# Patient Record
Sex: Female | Born: 1985 | Race: Black or African American | Hispanic: No | Marital: Single | Smoking: Current every day smoker
Health system: Southern US, Community
[De-identification: ages and names within clinical notes are randomized; demographics above are authoritative.]

## PROBLEM LIST (undated history)

## (undated) DIAGNOSIS — D573 Sickle-cell trait: Secondary | ICD-10-CM

## (undated) DIAGNOSIS — Z86718 Personal history of other venous thrombosis and embolism: Secondary | ICD-10-CM

## (undated) DIAGNOSIS — I10 Essential (primary) hypertension: Secondary | ICD-10-CM

## (undated) HISTORY — DX: Personal history of other venous thrombosis and embolism: Z86.718

## (undated) HISTORY — DX: Sickle-cell trait: D57.3

## (undated) HISTORY — DX: Essential (primary) hypertension: I10

---
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
## 2005-07-04 ENCOUNTER — Ambulatory Visit (HOSPITAL_COMMUNITY): Admission: AD | Admit: 2005-07-04 | Discharge: 2005-07-04 | Payer: Self-pay | Admitting: Obstetrics & Gynecology

## 2007-06-20 ENCOUNTER — Emergency Department (HOSPITAL_COMMUNITY): Admission: EM | Admit: 2007-06-20 | Discharge: 2007-06-20 | Payer: Self-pay | Admitting: Emergency Medicine

## 2007-12-17 ENCOUNTER — Emergency Department (HOSPITAL_COMMUNITY): Admission: EM | Admit: 2007-12-17 | Discharge: 2007-12-17 | Payer: Self-pay | Admitting: Emergency Medicine

## 2007-12-18 ENCOUNTER — Emergency Department (HOSPITAL_COMMUNITY): Admission: EM | Admit: 2007-12-18 | Discharge: 2007-12-18 | Payer: Self-pay | Admitting: Emergency Medicine

## 2007-12-20 ENCOUNTER — Ambulatory Visit: Payer: Self-pay | Admitting: Family Medicine

## 2007-12-20 DIAGNOSIS — E669 Obesity, unspecified: Secondary | ICD-10-CM

## 2007-12-20 DIAGNOSIS — F17200 Nicotine dependence, unspecified, uncomplicated: Secondary | ICD-10-CM

## 2007-12-20 DIAGNOSIS — M25579 Pain in unspecified ankle and joints of unspecified foot: Secondary | ICD-10-CM | POA: Insufficient documentation

## 2007-12-21 ENCOUNTER — Encounter (INDEPENDENT_AMBULATORY_CARE_PROVIDER_SITE_OTHER): Payer: Self-pay | Admitting: Family Medicine

## 2008-01-02 ENCOUNTER — Encounter (INDEPENDENT_AMBULATORY_CARE_PROVIDER_SITE_OTHER): Payer: Self-pay | Admitting: Family Medicine

## 2008-01-13 ENCOUNTER — Encounter (INDEPENDENT_AMBULATORY_CARE_PROVIDER_SITE_OTHER): Payer: Self-pay | Admitting: Family Medicine

## 2008-01-20 ENCOUNTER — Ambulatory Visit: Payer: Self-pay | Admitting: Family Medicine

## 2008-02-01 ENCOUNTER — Encounter (INDEPENDENT_AMBULATORY_CARE_PROVIDER_SITE_OTHER): Payer: Self-pay | Admitting: Family Medicine

## 2008-02-28 ENCOUNTER — Encounter (INDEPENDENT_AMBULATORY_CARE_PROVIDER_SITE_OTHER): Payer: Self-pay | Admitting: Family Medicine

## 2008-02-29 LAB — CONVERTED CEMR LAB
Alkaline Phosphatase: 77 units/L (ref 39–117)
Chloride: 107 meq/L (ref 96–112)
Creatinine, Ser: 0.84 mg/dL (ref 0.40–1.20)
Glucose, Bld: 74 mg/dL (ref 70–99)
Sodium: 141 meq/L (ref 135–145)
TSH: 3.084 microintl units/mL (ref 0.350–4.50)
Total CHOL/HDL Ratio: 4.3
Triglycerides: 115 mg/dL (ref <150)
VLDL: 23 mg/dL (ref 0–40)

## 2008-03-02 ENCOUNTER — Ambulatory Visit: Payer: Self-pay | Admitting: Family Medicine

## 2008-03-02 LAB — CONVERTED CEMR LAB: Cholesterol, target level: 200 mg/dL

## 2008-04-13 ENCOUNTER — Ambulatory Visit: Payer: Self-pay | Admitting: Family Medicine

## 2008-04-13 LAB — CONVERTED CEMR LAB

## 2008-05-18 ENCOUNTER — Encounter (INDEPENDENT_AMBULATORY_CARE_PROVIDER_SITE_OTHER): Payer: Self-pay | Admitting: Family Medicine

## 2008-06-01 HISTORY — PX: IUD REMOVAL: SHX5392

## 2008-06-13 ENCOUNTER — Encounter (INDEPENDENT_AMBULATORY_CARE_PROVIDER_SITE_OTHER): Payer: Self-pay | Admitting: Family Medicine

## 2008-06-25 ENCOUNTER — Encounter (INDEPENDENT_AMBULATORY_CARE_PROVIDER_SITE_OTHER): Payer: Self-pay | Admitting: Family Medicine

## 2008-07-04 ENCOUNTER — Encounter (INDEPENDENT_AMBULATORY_CARE_PROVIDER_SITE_OTHER): Payer: Self-pay | Admitting: Family Medicine

## 2008-12-24 ENCOUNTER — Emergency Department (HOSPITAL_COMMUNITY): Admission: EM | Admit: 2008-12-24 | Discharge: 2008-12-24 | Payer: Self-pay | Admitting: Emergency Medicine

## 2009-05-13 ENCOUNTER — Emergency Department (HOSPITAL_COMMUNITY): Admission: EM | Admit: 2009-05-13 | Discharge: 2009-05-13 | Payer: Self-pay | Admitting: Emergency Medicine

## 2010-09-07 LAB — URINALYSIS, ROUTINE W REFLEX MICROSCOPIC
Bilirubin Urine: NEGATIVE
Nitrite: POSITIVE — AB
Specific Gravity, Urine: 1.01 (ref 1.005–1.030)
Urobilinogen, UA: 0.2 mg/dL (ref 0.0–1.0)

## 2010-09-07 LAB — URINE CULTURE: Colony Count: >=100000

## 2010-09-07 LAB — URINE MICROSCOPIC-ADD ON

## 2010-09-07 LAB — PREGNANCY, URINE: Preg Test, Ur: NEGATIVE

## 2010-10-17 NOTE — Consult Note (Signed)
[name], [name] NO.:  [id number]   MEDICAL RECORD NO.:  [id number]          PATIENT TYPE:  OBV   LOCATION:  A415                          FACILITY:  [facility]   PHYSICIAN:  Lazaro Arms, M.D.   DATE OF BIRTH:  05-03-1986   DATE OF CONSULTATION:  07/04/2005  DATE OF DISCHARGE:                                   CONSULTATION   Stacie Oliver is an 25 year old African-American female, gravida 1, estimated date  of delivery of April 13, currently at [redacted] weeks gestation who presented  complaining of lower abdominal pain. She was actually a patient of Dr.  Duanne Moron up at [facility] but was getting her hair fixed here in  [place]. She said she had been having the cramping since yesterday. She  also stated that when she went to the bathroom she had leakage of fluid but  only when she went to urinate. Her Nitrazine is negative. She has no fluid  at all. Her cervix is long, thick and closed. She is having occasional  cramping. Her urinalysis is clear. She was given 1 subcu terbutaline which  completed knocked out the cramping. She was instructed to go home, rest and  increase intact of her fluid. If she has any difficulty, to contact Dr.  Gilford Silvius or [facility] for whoever is covering Dr. Duanne Moron practice. The  patient voices understanding, and she will go home and rest and drink fluids  today.      Lazaro Arms, M.D.  Electronically Signed     LHE/MEDQ  D:  07/04/2005  T:  07/04/2005  Job:  [id number]   cc:   Almetta Lovely  Fax: [phone]
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
## 2020-08-13 ENCOUNTER — Emergency Department: Payer: Self-pay

## 2020-08-13 ENCOUNTER — Emergency Department
Admission: EM | Admit: 2020-08-13 | Discharge: 2020-08-13 | Disposition: A | Discharge status: Home / Self Care | Payer: Self-pay | Attending: Emergency Medicine | Admitting: Emergency Medicine

## 2020-08-13 DIAGNOSIS — M79605 Pain in left leg: Secondary | ICD-10-CM | POA: Insufficient documentation

## 2020-08-13 MED ORDER — HYDROCODONE-ACETAMINOPHEN 5-325 MG PO TABS
ORAL_TABLET | ORAL | Status: AC
Start: 2020-08-13 — End: ?
  Filled 2020-08-13: qty 2

## 2020-08-13 MED ORDER — IBUPROFEN 600 MG PO TABS
600.0000 mg | ORAL_TABLET | Freq: Four times a day (QID) | ORAL | 0 refills | Status: AC | PRN
Start: 2020-08-13 — End: ?

## 2020-08-13 MED ORDER — HYDROCODONE-ACETAMINOPHEN 5-325 MG PO TABS
2.0000 | ORAL_TABLET | Freq: Once | ORAL | Status: AC
Start: 2020-08-13 — End: 2020-08-13
  Administered 2020-08-13: 08:00:00 2 via ORAL

## 2020-08-13 MED ORDER — HYDROCODONE-ACETAMINOPHEN 5-325 MG PO TABS
1.0000 | ORAL_TABLET | Freq: Four times a day (QID) | ORAL | 0 refills | Status: DC | PRN
Start: 2020-08-13 — End: 2020-08-14

## 2020-08-13 NOTE — Discharge Instructions (Signed)
Myalgias  Myalgias are another word formuscle aches and soreness.This is a symptom, not a disease.Myalgias can have many causes.A cold, the flu,or any infection can cause them.So can any illness with a high fever.They may happen after heavy exercise or injury such as an accident or fall.Some medicines such as statins and certain antidepressants can cause myalgias.They can also be a symptom of long-term (chronic) health problems such as lupus,chronic fatigue, or hypothyroidism. With these illnesses, other serious symptoms often occur with muscle pain and soreness.   Myalgias most often go away on their own. If they don't go away, come back, or are severe, you may need tests to help find the cause.   Home care   Rest until you feel better.   Follow instructions that you were given for how to care for yourself. This may depend on the cause of your myalgias.   If myalgia is thought to be because of a medicine, talk with the doctor who prescribed the medicine about what to do.   To control pain, take prescription or over-the-counter medicines as directed. Unless told not to,you can try acetaminophen or ibuprofen.    Follow-up care  Follow up with your healthcare provider or as advised. If your symptoms don't go away in a few days or if they come back, follow up with your healthcare provider for an exam and testing.   When to see medical advice  Call your healthcare provider for any of the following:   Fever of100.19F (38C)or higher, or as directed by your healthcare provider   Pain that gets worse and not better, or that goes away and comes back   New joint pains   New rash   Severe headache, neck pain, drowsiness, or confusion  StayWell last reviewed this educational content on 12/30/2017     2000-2021 [employer], [place]. All rights reserved. This information is not intended as a substitute for professional medical care. Always follow your healthcare professional's  instructions.            Muscle Strain in the Extremities  A muscle strain is a stretching and tearing of muscle fibers. This causes pain, especially when you move that muscle. There may also be some swelling and bruising.  Home care   Keep the hurt area raised above heart level to reduce pain and swelling. This is especially important during the first 48 hours.   Apply an ice pack over the injured area for 15 to 20 minutes every3 to 6hours. You should do this forthe first24 to 48 hours.You can make an ice pack by filling a plastic bag that seals at the top with ice cubes and then wrapping it with a thin towel. Be careful not to injure your skin with the ice treatments. Ice should never be applied directly to skin. Continue the use of ice packs for relief of pain and swelling as needed. After 48 hours, apply heat(warm shower orwarm bath)for 15 to 20 minutes several times a day, or alternate ice and heat.   You may useover-the-counter pain medicine to control pain, unless another medicine was prescribed. If you have chronic liver or kidney disease or ever had a stomach ulcer or gastrointestinal bleeding, talk with your healthcare providerbeforeusing these medicines.   For leg strains: If crutches have been recommended, dont put full weight on the hurt leg until you can do so without pain. You can return to sports when you are able to hop and run on the injured leg  without pain.  Follow-up care  Follow up with yourhealthcare provider, or as advised.  When to seek medical advice  Call your healthcare provider right away if any of these occur:   The toes of the injured leg becomeswollen, cold, blue, numb, or tingly   Pain or swelling increases  StayWell last reviewed this educational content on 09/29/2016     2000-2021 [employer], [place]. All rights reserved. This information is not intended as a substitute for professional medical care. Always follow your healthcare professional's  instructions.

## 2020-08-13 NOTE — ED Provider Notes (Addendum)
[facility]  EMERGENCY DEPARTMENT  History and Physical Exam       ________________________________________________________________________        Patient Name:  Mercedes Moss, Mercedes Moss   Age/Sex:  35 y.o.  /  female     Attending Physician:  Kathrynn Running, MD   MRN:  [id number]     PCP:  Marisa Sprinkles, MD   Room:  E10/EDA10-A     Patent DOB:  1985/10/14   Encounter Date:  08/13/2020       ________________________________________________________________________      History of Presenting Illness     Chief complaint: Leg Pain    HPI/ROS is limited by: none  HPI/ROS given by: patient          Mercedes Moss is a 35 y.o. female who presents to the ED with complaint of Leg Pain      HPI     Context: Patient presents complaining of left calf pain.  She is a Biomedical scientist.  No trauma.  She denies any chest pain or shortness of breath.  No previous DVTs.    Provocative: Palpation of the left calf    Pallative Factors: None    Quality: Dull ache    Region: Left calf    Radiation: None    Severity: Moderate    Temporal Factors: Began yesterday        Associated symptoms: No chest pain or shortness of breath         Review of Systems        Review of Systems   Respiratory: Negative for shortness of breath.    Cardiovascular: Negative for chest pain.              Allergies & Medications     Pt is allergic to tomato.    Discharge Medication List as of 08/13/2020  9:43 AM               Past Medical History     Pt  has no past medical history on file.           Past Surgical History     Pt  has a past surgical history that includes ceasarian.         Family History     The family history is not on file.         Social History     Social History     Tobacco Use    Smoking status: Current Every Day Smoker    Smokeless tobacco: Never Used   Haematologist Use: Some days   Substance Use Topics    Alcohol use: Yes     Comment: wine every other weekend    Drug use: Never                    Physical Exam     Blood  pressure (!) 163/105, pulse 81, temperature 98.4 F (36.9 C), temperature source Oral, resp. rate 16, height 1.549 m, weight 100.9 kg, last menstrual period 07/23/2020, SpO2 99 %.       Physical Exam  Vitals and nursing note reviewed.   Constitutional:       General: She is not in acute distress.     Appearance: Normal appearance.   Cardiovascular:      Rate and Rhythm: Normal rate.   Pulmonary:      Effort: Pulmonary effort is normal.   Musculoskeletal:  Left lower leg: Tenderness present. No swelling. No edema.      Comments: Tenderness to palpation of the left calf and popliteal fossa.  No cords are noted   Neurological:      Mental Status: She is alert.              Orders Placed       Orders Placed This Encounter   Procedures    US Venous Leg Left         ED Medication Orders (From admission, onward)    Start Ordered     Status Ordering Provider    08/13/20 0706 08/13/20 0705  HYDROcodone-acetaminophen (NORCO) 5-325 MG per tablet 2 tablet  Once in ED        Route: Oral  Ordered Dose: 2 tablet     Last MAR action: Given Kathrynn Running                 Diagnostic Results     Laboratory results reviewed by ED provider:    Results     ** No results found for the last 24 hours. **          Radiologic study results reviewed by ED provider:    No results found.  US Venous Leg Left    Result Date: 08/13/2020  No evidence of deep vein thrombosis left leg. ReadingStation:[id number]      Rendering Provider: Kathrynn Running, MD           Procedures / EKG       EKG (interpreted by ED physician):      Procedures           MDM:        MDM    Calf strain versus DVT.  Ultrasound showed no DVT     Diagnosis / Disposition:     Final Impression  1. Left leg pain        Disposition  ED Disposition     ED Disposition   Discharge    Condition   --    Date/Time   Tue Aug 13, 2020  9:43 AM    Comment   Zannie C[place] discharge to home/self care.    Condition at disposition: Stable             Follow up Provider(s):  No follow-up  provider specified.      Prescriptions  Discharge Medication List as of 08/13/2020  9:43 AM      START taking these medications    Details   ibuprofen (ADVIL) 600 MG tablet Take 1 tablet (600 mg total) by mouth every 6 (six) hours as needed for Pain, Starting Tue 08/13/2020, E-Rx      HYDROcodone-acetaminophen (NORCO) 5-325 MG per tablet Take 1 tablet by mouth every 6 (six) hours as needed for Pain No driving or working while taking.  Avoid alcohol while taking, Starting Tue 08/13/2020, E-Rx                    ______________________________    This document is generated from an EMR system and may have additions and omissions that were not intended by the user.                  Kathrynn Running, MD  08/16/20 1021       Kathrynn Running, MD  08/16/20 1021       Kathrynn Running, MD  08/16/20 1024

## 2020-08-13 NOTE — ED Triage Notes (Signed)
Leg pain since last night, worsening overnight, hurts to ambulate. 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    History reviewed. No pertinent past medical history.    Chief Complaint   Patient presents with    Leg Pain       BP (!) 155/114    Pulse 89    Temp 98.4 F (36.9 C) (Oral)    Resp 20    Ht 1.549 m    Wt 100.9 kg    LMP 07/23/2020    SpO2 98%    BMI 42.03 kg/m

## 2020-08-14 ENCOUNTER — Telehealth: Payer: Self-pay

## 2020-08-14 MED ORDER — HYDROCODONE-ACETAMINOPHEN 5-325 MG PO TABS
1.0000 | ORAL_TABLET | Freq: Four times a day (QID) | ORAL | 0 refills | Status: AC | PRN
Start: 2020-08-14 — End: ?

## 2020-08-14 MED ORDER — HYDROCODONE-ACETAMINOPHEN 5-325 MG PO TABS
1.0000 | ORAL_TABLET | Freq: Four times a day (QID) | ORAL | 0 refills | Status: DC | PRN
Start: 2020-08-14 — End: 2020-08-14

## 2020-08-14 NOTE — Telephone Encounter (Signed)
Rx for Norco e-scribed to [facility] in [place], [place] [address] per earlier patient request by Dr Johnson(earlier attempted by Dr Vicente Males resent script to [facility]). This RN called and spoke with pharmacy at [place], [place], who advised that they received 2 rx's for norco and 1 for ibuprofen. Rx for norco cancelled at [facility]. Ibuprofen kept, as this rx can be transferred to [facility].

## 2020-08-14 NOTE — ED Provider Notes (Signed)
Prescription for Norco re-sent electronically to patient's home town in [place].      Gareth Morgan, MD  08/14/20 [phone]

## 2021-06-01 NOTE — L&D Delivery Note (Addendum)
[facility]N [facility] Delivery Note  Stacie Oliver is a 36 y.o. [id number] s/p VBAC at [redacted]w[redacted]d. She was admitted for IOL 2/2 IUFD @33wks .   ROM: 8h 36m with bloody, green fluid GBS Status: Unknown   Maximum Maternal Temperature: 98.2  Labor Progress: Initial SVE: closed. She then progressed to complete.   Delivery Date/Time: 10/2 @0224  Delivery: Called to room and patient was complete and delivered fetus en caul. Complete ROM after delivery. No nuchal cord present. Cord clamped x 2 by provider. Placenta delivered spontaneously. Fundus firm with massage and Pitocin continued. Infant taken over to warmer and cleaned off by nursing staff. Infant was wrapped in blanket and give to the mother for viewing. Emotional support offered at this time.  Baby Weight: pending  Placenta: intact. Sent to pathology Complications: IUFD Lacerations: None EBL: 50 mL Analgesia: Epidural   Infant: IUFD  Gerlene Fee, DO OB Fellow, [place] for [place] 03/02/2022, 2:56 AM

## 2021-10-13 ENCOUNTER — Encounter: Payer: Self-pay | Admitting: Adult Health

## 2021-10-13 ENCOUNTER — Ambulatory Visit (INDEPENDENT_AMBULATORY_CARE_PROVIDER_SITE_OTHER): Payer: 59 | Admitting: Adult Health

## 2021-10-13 VITALS — BP 143/94 | HR 83 | Ht 61.0 in | Wt 204.0 lb

## 2021-10-13 DIAGNOSIS — O09522 Supervision of elderly multigravida, second trimester: Secondary | ICD-10-CM

## 2021-10-13 DIAGNOSIS — Z98891 History of uterine scar from previous surgery: Secondary | ICD-10-CM | POA: Diagnosis not present

## 2021-10-13 DIAGNOSIS — Z86718 Personal history of other venous thrombosis and embolism: Secondary | ICD-10-CM

## 2021-10-13 DIAGNOSIS — Z3200 Encounter for pregnancy test, result unknown: Secondary | ICD-10-CM | POA: Insufficient documentation

## 2021-10-13 DIAGNOSIS — Z3A13 13 weeks gestation of pregnancy: Secondary | ICD-10-CM | POA: Insufficient documentation

## 2021-10-13 DIAGNOSIS — O3680X0 Pregnancy with inconclusive fetal viability, not applicable or unspecified: Secondary | ICD-10-CM | POA: Diagnosis not present

## 2021-10-13 DIAGNOSIS — Z3201 Encounter for pregnancy test, result positive: Secondary | ICD-10-CM | POA: Diagnosis not present

## 2021-10-13 DIAGNOSIS — R69 Illness, unspecified: Secondary | ICD-10-CM | POA: Diagnosis not present

## 2021-10-13 DIAGNOSIS — F17200 Nicotine dependence, unspecified, uncomplicated: Secondary | ICD-10-CM

## 2021-10-13 LAB — POCT URINE PREGNANCY: Preg Test, Ur: POSITIVE — AB

## 2021-10-13 MED ORDER — ENOXAPARIN SODIUM 100 MG/ML IJ SOSY: 90.0000 mg | PREFILLED_SYRINGE | Freq: Two times a day (BID) | INTRAMUSCULAR | 6 refills | Status: DC

## 2021-10-13 NOTE — Progress Notes (Signed)
?Subjective:  ?  ? Patient ID: Stacie Oliver, female   DOB: 12-Jul-1985, 36 y.o.   MRN: [id number] ? ?HPI ?Stacie Oliver is a 36 year old black female, married, G3P2002, in for UPT, had +pregnancy test in [place], when found to have DVT left leg.  ?She is a smoker and has history of 2 C-sections. ? ? ?Review of Systems ?Missed periods,+UPT ?Hx blood clot in left leg, 08/22/21 was given lovenox 90 mg bid and she stopped about 10 days ago, said she felt better ?Reviewed past medical,surgical, social and family history. Reviewed medications and allergies.  ?   ?Objective:  ? Physical Exam ?BP (!) 143/94 (BP Location: Left Arm, Patient Position: Sitting, Cuff Size: Large)   Pulse 83   Ht 5\' 1"  (1.549 m)   Wt 204 lb (92.5 kg)   LMP 07/08/2021 (Approximate)   BMI 38.55 kg/m?   she says had country ham, BP not usually elevated.  ?  +UPT, about 13+6 weeks by LMP with EDD 04/14/22. ?Skin warm and dry. Neck: mid line trachea, normal thyroid, good ROM, no lymphadenopathy noted. Lungs: clear to ausculation bilaterally. Cardiovascular: regular rate and rhythm.  ?AA is 0 ?Fall risk risk is low ? ?  10/13/2021  ? 10:54 AM  ?Depression screen PHQ 2/9  ?Decreased Interest 0  ?Down, Depressed, Hopeless 1  ?PHQ - 2 Score 1  ?Altered sleeping 0  ?Tired, decreased energy 1  ?Change in appetite 0  ?Feeling bad or failure about yourself  1  ?Trouble concentrating 0  ?Moving slowly or fidgety/restless 0  ?Suicidal thoughts 0  ?PHQ-9 Score 3  ?  ? ?  10/13/2021  ? 10:54 AM  ?GAD 7 : Generalized Anxiety Score  ?Nervous, Anxious, on Edge 1  ?Control/stop worrying 1  ?Worry too much - different things 1  ?Trouble relaxing 0  ?Restless 0  ?Easily annoyed or irritable 1  ?Afraid - awful might happen 0  ?Total GAD 7 Score 4  ? ?  ? [place] - 10/13/21 1054   ? ?  ? Pregnancy Intention Screening  ? Does the patient want to become pregnant in the next year? Yes   ? Does the patient's partner want to become pregnant in the next year? Yes   ? Would the  patient like to discuss contraceptive options today? No   ?  ? Contraception Wrap Up  ? Current Method Pregnant/Seeking Pregnancy   ? End Method Pregnant/Seeking Pregnancy   ? Contraception Counseling Provided No   ? ?  ?  ? ?  ?  ?Assessment:  ?   ?1. Possible pregnancy ?+UPT ?- POCT urine pregnancy ? ?2. [redacted] weeks gestation of pregnancy ?Take OTC PNV ?Review handout by [facility] ? ?3. History of DVT (deep vein thrombosis) ?Get back on lovenox ?Discussed with Dr Nelda Marseille to resume same dose bid  ?Meds ordered this encounter  ?Medications  ? enoxaparin (LOVENOX) 100 MG/ML injection  ?  Sig: Inject 0.9 mLs (90 mg total) into the skin every 12 (twelve) hours.  ?  Dispense:  30 mL  ?  Refill:  6  ?  Order Specific Question:   Supervising Provider  ?  Answer:   Tania Ade H [[id number]]  ?  ? ?4. TOBACCO ABUSE ?Try to cut down with goal of stopping ? ?5. History of 2 cesarean sections ? ?6. Multigravida of advanced maternal age in second trimester ? ? ?7. Encounter to determine fetal viability of pregnancy, single or unspecified fetus ?  Get in for dating [place] ASAP ?- US OB Comp Less 14 Wks; Future  ?   ?Plan:  ?   ?See MD's, high risk pregnancy  ?   ?

## 2021-11-04 ENCOUNTER — Encounter: Payer: Medicaid Other | Admitting: Obstetrics & Gynecology

## 2021-11-04 ENCOUNTER — Ambulatory Visit (INDEPENDENT_AMBULATORY_CARE_PROVIDER_SITE_OTHER): Payer: 59

## 2021-11-04 ENCOUNTER — Other Ambulatory Visit: Payer: Self-pay | Admitting: Adult Health

## 2021-11-04 DIAGNOSIS — Z3A17 17 weeks gestation of pregnancy: Secondary | ICD-10-CM

## 2021-11-04 DIAGNOSIS — Z363 Encounter for antenatal screening for malformations: Secondary | ICD-10-CM

## 2021-11-04 DIAGNOSIS — O3680X0 Pregnancy with inconclusive fetal viability, not applicable or unspecified: Secondary | ICD-10-CM

## 2021-11-04 NOTE — Progress Notes (Signed)
LIMITED US 17 wks,breech,anterior placenta gr 0,normal ovaries,svp of fluid 3.8 cm,cx 4.8 cm,fhr 146 bpm,EFW 217 g 93%,EDD 04/14/2022 by LMP,limited view because of fetal age and body habitus,please have pt come back for anatomy scan

## 2021-11-17 ENCOUNTER — Encounter: Payer: Self-pay | Admitting: Women's Health

## 2021-11-17 DIAGNOSIS — O0990 Supervision of high risk pregnancy, unspecified, unspecified trimester: Secondary | ICD-10-CM | POA: Insufficient documentation

## 2021-11-17 DIAGNOSIS — Z3490 Encounter for supervision of normal pregnancy, unspecified, unspecified trimester: Secondary | ICD-10-CM | POA: Insufficient documentation

## 2021-11-18 ENCOUNTER — Ambulatory Visit: Payer: Medicaid Other | Admitting: *Deleted

## 2021-11-18 ENCOUNTER — Other Ambulatory Visit (HOSPITAL_COMMUNITY)
Admission: RE | Admit: 2021-11-18 | Discharge: 2021-11-18 | Disposition: A | Discharge status: Home / Self Care | Payer: 59 | Source: Ambulatory Visit | Attending: Women's Health | Admitting: Women's Health

## 2021-11-18 ENCOUNTER — Ambulatory Visit (INDEPENDENT_AMBULATORY_CARE_PROVIDER_SITE_OTHER): Payer: 59 | Admitting: Women's Health

## 2021-11-18 ENCOUNTER — Encounter: Payer: Self-pay | Admitting: Women's Health

## 2021-11-18 VITALS — BP 134/90 | HR 88 | Wt 213.8 lb

## 2021-11-18 DIAGNOSIS — Z98891 History of uterine scar from previous surgery: Secondary | ICD-10-CM

## 2021-11-18 DIAGNOSIS — O0992 Supervision of high risk pregnancy, unspecified, second trimester: Secondary | ICD-10-CM

## 2021-11-18 DIAGNOSIS — O0990 Supervision of high risk pregnancy, unspecified, unspecified trimester: Secondary | ICD-10-CM

## 2021-11-18 DIAGNOSIS — Z3480 Encounter for supervision of other normal pregnancy, unspecified trimester: Secondary | ICD-10-CM

## 2021-11-18 DIAGNOSIS — Z1389 Encounter for screening for other disorder: Secondary | ICD-10-CM

## 2021-11-18 DIAGNOSIS — O10919 Unspecified pre-existing hypertension complicating pregnancy, unspecified trimester: Secondary | ICD-10-CM | POA: Diagnosis not present

## 2021-11-18 DIAGNOSIS — Z131 Encounter for screening for diabetes mellitus: Secondary | ICD-10-CM

## 2021-11-18 DIAGNOSIS — Z86718 Personal history of other venous thrombosis and embolism: Secondary | ICD-10-CM

## 2021-11-18 DIAGNOSIS — O2342 Unspecified infection of urinary tract in pregnancy, second trimester: Secondary | ICD-10-CM

## 2021-11-18 LAB — POCT URINALYSIS DIPSTICK OB
Glucose, UA: NEGATIVE
Ketones, UA: NEGATIVE
Nitrite, UA: POSITIVE

## 2021-11-18 MED ORDER — BLOOD PRESSURE MONITOR MISC: 0 refills | Status: DC

## 2021-11-18 MED ORDER — NITROFURANTOIN MONOHYD MACRO 100 MG PO CAPS: 100.0000 mg | ORAL_CAPSULE | Freq: Two times a day (BID) | ORAL | 0 refills | Status: DC

## 2021-11-18 MED ORDER — ASPIRIN 81 MG PO TBEC: 162.0000 mg | DELAYED_RELEASE_TABLET | Freq: Every day | ORAL | 2 refills | Status: DC

## 2021-11-18 MED ORDER — ENOXAPARIN SODIUM 100 MG/ML IJ SOSY: 100.0000 mg | PREFILLED_SYRINGE | Freq: Two times a day (BID) | INTRAMUSCULAR | 6 refills | Status: AC

## 2021-11-18 NOTE — Progress Notes (Signed)
Patient believe she may need to start taking iron and folic due to her "being cold"

## 2021-11-18 NOTE — Patient Instructions (Signed)
Stacie Oliver, thank you for choosing our office today! We appreciate the opportunity to meet your healthcare needs. You may receive a short survey by mail, e-mail, or through [employer]. If you are happy with your care we would appreciate if you could take just a few minutes to complete the survey questions. We read all of your comments and take your feedback very seriously. Thank you again for choosing our office.  Center for [employer] Team at [facility] at [facility] ([address] [place], [place] [address]) Entrance C, located off of [address] Free 24/7 valet parking  Go to [email or web address] to register for FREE online childbirth classes  Call the office [phone]) or go to [facility] if: You begin to severe cramping Your water breaks.  Sometimes it is a big gush of fluid, sometimes it is just a trickle that keeps getting your panties wet or running down your legs You have vaginal bleeding.  It is normal to have a small amount of spotting if your cervix was checked.   [facility] Pediatricians/Family Doctors [facility]): [address] Dr. Colette Ribas, [phone]           [facility]: [address], [phone]                [facility]): [address], [phone] (call to ask if accepting patients) [facility]: [address], [place], [phone]    [facility]ians/Family Doctors [facility]): [phone] [address], [phone] [facility]: [address] [place], [phone] [facility]: [address], [phone]  [facility] Doctors  [facility]): [phone] [facility]: [address], [phone]   [facility] Doctors [facility]: [address], [phone]  [facility] Doctors  [facility]: [phone], [phone]  Home Blood Pressure Monitoring for Patients   Your provider has recommended that you check your blood pressure (BP) at least once a week at home. If you do not have a blood pressure cuff at home, one will be provided for you. Contact your provider if you have not received your monitor within 1 week.   Helpful Tips for Accurate Home Blood Pressure Checks  Don't smoke, exercise, or drink caffeine 30 minutes before checking your BP Use the restroom before checking your BP (a full bladder can raise your pressure) Relax in a comfortable upright chair Feet on the ground Left arm resting comfortably on a flat surface at the level of your heart Legs uncrossed Back supported Sit quietly and don't talk Place the cuff on your bare arm Adjust snuggly, so that only two fingertips can fit between your skin and the top of the cuff Check 2 readings separated by at least one minute Keep a log of your BP readings For a visual, please reference this diagram: http://ccnc.care/bpdiagram  Provider Name: [facility]     Phone: [phone]  Zone 1: ALL CLEAR  Continue to monitor your symptoms:  BP reading is less than 140 (top number) or less than 90 (bottom number)  No right upper stomach pain No headaches or seeing spots No feeling nauseated or throwing up No swelling in face and hands  Zone 2: CAUTION Call your doctor's office for any of the following:  BP reading is greater than 140 (top number) or greater than  90 (bottom number)  Stomach pain under your ribs in the middle or right side Headaches or seeing spots Feeling nauseated or throwing up Swelling in face and hands  Zone 3: EMERGENCY  Seek immediate medical care if you have any of the following:  BP reading is greater than160 (top number) or greater than 110 (bottom number) Severe headaches not improving with Tylenol Serious difficulty catching your breath Any worsening symptoms from  Zone 2     Second Trimester of Pregnancy The second trimester is from week 14 through week 27 (months 4 through 6). The second trimester is often a time when you feel your best. Your body has adjusted to being pregnant, and you begin to feel better physically. Usually, morning sickness has lessened or quit completely, you may have more energy, and you may have an increase in appetite. The second trimester is also a time when the fetus is growing rapidly. At the end of the sixth month, the fetus is about 9 inches long and weighs about 1 pounds. You will likely begin to feel the baby move (quickening) between 16 and 20 weeks of pregnancy. Body changes during your second trimester Your body continues to go through many changes during your second trimester. The changes vary from woman to woman. Your weight will continue to increase. You will notice your lower abdomen bulging out. You may begin to get stretch marks on your hips, abdomen, and breasts. You may develop headaches that can be relieved by medicines. The medicines should be approved by your health care provider. You may urinate more often because the fetus is pressing on your bladder. You may develop or continue to have heartburn as a result of your pregnancy. You may develop constipation because certain hormones are causing the muscles that push waste through your intestines to slow down. You may develop hemorrhoids or swollen, bulging veins (varicose veins). You may have back pain. This is caused by: Weight gain. Pregnancy hormones that are relaxing the joints in your pelvis. A shift in weight and the muscles that support your balance. Your breasts will continue to grow and they will continue to become tender. Your gums may bleed and may be sensitive to brushing and flossing. Dark spots or blotches (chloasma, mask of pregnancy) may develop on your face. This will likely fade after the baby is born. A dark line from your belly button to  the pubic area (linea nigra) may appear. This will likely fade after the baby is born. You may have changes in your hair. These can include thickening of your hair, rapid growth, and changes in texture. Some women also have hair loss during or after pregnancy, or hair that feels dry or thin. Your hair will most likely return to normal after your baby is born.  What to expect at prenatal visits During a routine prenatal visit: You will be weighed to make sure you and the fetus are growing normally. Your blood pressure will be taken. Your abdomen will be measured to track your baby's growth. The fetal heartbeat will be listened to. Any test results from the previous visit will be discussed.  Your health care provider may ask you: How you are feeling. If you are feeling the baby move. If you have had any abnormal symptoms, such as leaking fluid, bleeding, severe headaches, or abdominal cramping. If you are using any tobacco products, including cigarettes, chewing tobacco, and electronic cigarettes. If you have any questions.  Other tests that may be performed during   your second trimester include: Blood tests that check for: Low iron levels (anemia). High blood sugar that affects pregnant women (gestational diabetes) between 24 and 28 weeks. Rh antibodies. This is to check for a protein on red blood cells (Rh factor). Urine tests to check for infections, diabetes, or protein in the urine. An ultrasound to confirm the proper growth and development of the baby. An amniocentesis to check for possible genetic problems. Fetal screens for spina bifida and Down syndrome. HIV (human immunodeficiency virus) testing. Routine prenatal testing includes screening for HIV, unless you choose not to have this test.  Follow these instructions at home: Medicines Follow your health care provider's instructions regarding medicine use. Specific medicines may be either safe or unsafe to take during  pregnancy. Take a prenatal vitamin that contains at least 600 micrograms (mcg) of folic acid. If you develop constipation, try taking a stool softener if your health care provider approves. Eating and drinking Eat a balanced diet that includes fresh fruits and vegetables, whole grains, good sources of protein such as meat, eggs, or tofu, and low-fat dairy. Your health care provider will help you determine the amount of weight gain that is right for you. Avoid raw meat and uncooked cheese. These carry germs that can cause birth defects in the baby. If you have low calcium intake from food, talk to your health care provider about whether you should take a daily calcium supplement. Limit foods that are high in fat and processed sugars, such as fried and sweet foods. To prevent constipation: Drink enough fluid to keep your urine clear or pale yellow. Eat foods that are high in fiber, such as fresh fruits and vegetables, whole grains, and beans. Activity Exercise only as directed by your health care provider. Most women can continue their usual exercise routine during pregnancy. Try to exercise for 30 minutes at least 5 days a week. Stop exercising if you experience uterine contractions. Avoid heavy lifting, wear low heel shoes, and practice good posture. A sexual relationship may be continued unless your health care provider directs you otherwise. Relieving pain and discomfort Wear a good support bra to prevent discomfort from breast tenderness. Take warm sitz baths to soothe any pain or discomfort caused by hemorrhoids. Use hemorrhoid cream if your health care provider approves. Rest with your legs elevated if you have leg cramps or low back pain. If you develop varicose veins, wear support hose. Elevate your feet for 15 minutes, 3-4 times a day. Limit salt in your diet. Prenatal Care Write down your questions. Take them to your prenatal visits. Keep all your prenatal visits as told by your health  care provider. This is important. Safety Wear your seat belt at all times when driving. Make a list of emergency phone numbers, including numbers for family, friends, the hospital, and police and fire departments. General instructions Ask your health care provider for a referral to a local prenatal education class. Begin classes no later than the beginning of month 6 of your pregnancy. Ask for help if you have counseling or nutritional needs during pregnancy. Your health care provider can offer advice or refer you to specialists for help with various needs. Do not use hot tubs, steam rooms, or saunas. Do not douche or use tampons or scented sanitary pads. Do not cross your legs for long periods of time. Avoid cat litter boxes and soil used by cats. These carry germs that can cause birth defects in the baby and possibly loss of the   fetus by miscarriage or stillbirth. Avoid all smoking, herbs, alcohol, and unprescribed drugs. Chemicals in these products can affect the formation and growth of the baby. Do not use any products that contain nicotine or tobacco, such as cigarettes and e-cigarettes. If you need help quitting, ask your health care provider. Visit your dentist if you have not gone yet during your pregnancy. Use a soft toothbrush to brush your teeth and be gentle when you floss. Contact a health care provider if: You have dizziness. You have mild pelvic cramps, pelvic pressure, or nagging pain in the abdominal area. You have persistent nausea, vomiting, or diarrhea. You have a bad smelling vaginal discharge. You have pain when you urinate. Get help right away if: You have a fever. You are leaking fluid from your vagina. You have spotting or bleeding from your vagina. You have severe abdominal cramping or pain. You have rapid weight gain or weight loss. You have shortness of breath with chest pain. You notice sudden or extreme swelling of your face, hands, ankles, feet, or legs. You  have not felt your baby move in over an hour. You have severe headaches that do not go away when you take medicine. You have vision changes. Summary The second trimester is from week 14 through week 27 (months 4 through 6). It is also a time when the fetus is growing rapidly. Your body goes through many changes during pregnancy. The changes vary from woman to woman. Avoid all smoking, herbs, alcohol, and unprescribed drugs. These chemicals affect the formation and growth your baby. Do not use any tobacco products, such as cigarettes, chewing tobacco, and e-cigarettes. If you need help quitting, ask your health care provider. Contact your health care provider if you have any questions. Keep all prenatal visits as told by your health care provider. This is important. This information is not intended to replace advice given to you by your health care provider. Make sure you discuss any questions you have with your health care provider. Document Released: 05/12/2001 Document Revised: 10/24/2015 Document Reviewed: 07/19/2012 Elsevier Interactive Patient Education  2017 Elsevier Inc.  

## 2021-11-18 NOTE — Progress Notes (Signed)
INITIAL OBSTETRICAL VISIT Patient name: Stacie Oliver MRN [id number]  Date of birth: 01/05/1986 Chief Complaint:   Initial Prenatal Visit  History of Present Illness:   Stacie Oliver is a 36 y.o. G73P2002 African-American female at [redacted]w[redacted]d by LMP c/w u/s at 17 weeks with an Estimated Date of Delivery: 04/14/22 being seen today for her initial obstetrical visit.   Patient's last menstrual period was 07/08/2021 (approximate). Her obstetrical history is significant for  1st pregnancy: term c/s 'wouldn't dilate' at [facility], 2nd pregnancy: term ERCS at [facility], no records available in [place] .   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 Results were: negative per pt report at [facility]     11/18/2021    2:56 PM 10/13/2021   10:54 AM  Depression screen PHQ 2/9  Decreased Interest 2 0  Down, Depressed, Hopeless 0 1  PHQ - 2 Score 2 1  Altered sleeping 1 0  Tired, decreased energy 1 1  Change in appetite 1 0  Feeling bad or failure about yourself  0 1  Trouble concentrating 0 0  Moving slowly or fidgety/restless 0 0  Suicidal thoughts 0 0  PHQ-9 Score 5 3        11/18/2021    2:58 PM 10/13/2021   10:54 AM  GAD 7 : Generalized Anxiety Score  Nervous, Anxious, on Edge 1 1  Control/stop worrying 1 1  Worry too much - different things 1 1  Trouble relaxing 0 0  Restless 0 0  Easily annoyed or irritable 1 1  Afraid - awful might happen 0 0  Total GAD 7 Score 4 4     Review of Systems:   Pertinent items are noted in HPI Denies cramping/contractions, leakage of fluid, vaginal bleeding, abnormal vaginal discharge w/ itching/odor/irritation, headaches, visual changes, shortness of breath, chest pain, abdominal pain, severe  nausea/vomiting, or problems with urination or bowel movements unless otherwise stated above.  Pertinent History Reviewed:  Reviewed past medical,surgical, social, obstetrical and family history.  Reviewed problem list, medications and allergies. OB History  Gravida Para Term Preterm AB Living  3 2 2     2   SAB IAB Ectopic Multiple Live Births          2    # Outcome Date GA Lbr Len/2nd Weight Sex Delivery Anes PTL Lv  3 Current           2 Term 12/17/06   7 lb (3.175 kg) F CS-LTranv   LIV     Birth Comments: RCS  1 Term 09/09/05   7 lb (3.175 kg) F CS-LTranv   LIV     Birth Comments: C/S for FTP per pt   Physical Assessment:   Vitals:   11/18/21 1345  BP: 134/90  Pulse: 88  Weight: 213 lb 12.8 oz (97 kg)  Body mass index is 40.4 kg/m.       Physical Examination:  General appearance - well appearing, and in no distress  Mental status - alert, oriented to person, place, and time  Psych:  She has a normal mood and affect  Skin - warm and dry, normal color, no suspicious lesions noted  Chest - effort normal, all  lung fields clear to auscultation bilaterally  Heart - normal rate and regular rhythm  Abdomen - soft, nontender  Extremities:  No swelling or varicosities noted  Pelvic - VULVA: normal appearing vulva with no masses, tenderness or lesions  VAGINA: normal appearing vagina with normal color and discharge, no lesions  CERVIX: normal appearing cervix without discharge or lesions, no CMT  Thin prep pap is done w/ HR HPV cotesting  Chaperone:  Zwaye Banton, CMA     TODAY'S FHR: 145 via doppler  Results for orders placed or performed in visit on 11/18/21 (from the past 24 hour(s))  POC Urinalysis Dipstick OB   Collection Time: 11/18/21  2:36 PM  Result Value Ref Range   Color, UA     Clarity, UA     Glucose, UA Negative Negative   Bilirubin, UA     Ketones, UA negative    Spec Grav, UA     Blood, UA Trace    pH, UA     POC,PROTEIN,UA     Urobilinogen, UA      Nitrite, UA Positive    Leukocytes, UA Large (3+) (A) Negative   Appearance     Odor      Assessment & Plan:  1) High-Risk Pregnancy G3P2002 at [redacted]w[redacted]d with an Estimated Date of Delivery: 04/14/22   2) Initial OB visit  3) CHTN> dx today, elevated bp last visit and today, no hx, ASA 162mg , get baseline labs  4) H/O DVT> discussed dose w/ LHE, increase to 100mg  BID, pt to set alarm to remind her to take 2nd dose. Discussed importance of this  5) Prev c/s x 2> 1st FTP per pt, 2nd ERCS, wants TOLAC if possible. Records requested from [facility] from both c/s (do not see in CareEverywhere)  6) Smoker> advised cessation  7) AMA 35yo  8) UTI> rx macrobid, send urine cx   Meds:  Meds ordered this encounter  Medications   enoxaparin (LOVENOX) 100 MG/ML injection    Sig: Inject 1 mL (100 mg total) into the skin every 12 (twelve) hours.    Dispense:  30 mL    Refill:  6    Order Specific Question:   Supervising Provider    Answer:   , LUTHER H [[id number]]   nitrofurantoin, macrocrystal-monohydrate, (MACROBID) 100 MG capsule    Sig: Take 1 capsule (100 mg total) by mouth 2 (two) times daily. X 7 days    Dispense:  14 capsule    Refill:  0    Order Specific Question:   Supervising Provider    Answer:   [facility] H [[id number]]   Blood Pressure Monitor MISC    Sig: For regular home bp monitoring during pregnancy    Dispense:  1 each    Refill:  0    O09.91 Please mail to patient   aspirin EC 81 MG tablet    Sig: Take 2 tablets (162 mg total) by mouth daily. Swallow whole.    Dispense:  180 tablet    Refill:  2    Order Specific Question:   Supervising Provider    Answer:   Desp[facility] Hidden H [[id number]]    Initial labs obtained Continue prenatal vitamins Reviewed n/v relief measures and warning s/s to report Reviewed recommended weight gain based on pre-gravid BMI Encouraged well-balanced diet Genetic & carrier screening discussed: requests Panorama, AFP, and Horizon , too late for  NT/IT Ultrasound discussed; fetal survey:  limited view of heart @ 17wks, scheduled  to repeat anatomy u/s 7/10 [facility] completed> form faxed if has or is planning to apply for medicaid The nature of [facility] with multiple MDs and other Advanced Practice Providers was explained to patient; also emphasized that fellows, residents, and students are part of our team. Does not have home bp cuff. Office bp cuff given: no. Rx sent: yes. Check bp weekly, let us know if consistently >140/90.   Indications for early A1C (per uptodate) BMI >=25 (>=23 in Asian women) AND one of the following First-degree relative with diabetes Yes High-risk race/ethnicity (eg, African American, Latino, Native American, [place] American, [place] Islander) Yes HTN or on therapy for hypertension Yes  Follow-up: Return for As scheduled; get c/s op [facility] from 2007 & [facility] please.   Orders Placed This Encounter  Procedures   Urine Culture   AFP, Serum, Open Spina Bifida   Hemoglobin A1c   CBC/D/Plt+RPR+Rh+ABO+RubIgG...   Genetic Screening   Comprehensive metabolic panel   Protein / creatinine ratio, urine   Panorama Prenatal Test Full Panel   HORIZON CUSTOM   POC Urinalysis Dipstick OB    Cheral Marker CNM, [facility] 11/18/2021 3:05 PM

## 2021-11-19 LAB — COMPREHENSIVE METABOLIC PANEL
ALT: 7 IU/L (ref 0–32)
AST: 9 IU/L (ref 0–40)
Albumin/Globulin Ratio: 1.5 (ref 1.2–2.2)
Albumin: 3.5 g/dL — ABNORMAL LOW (ref 3.8–4.8)
Alkaline Phosphatase: 79 IU/L (ref 44–121)
BUN/Creatinine Ratio: 10 (ref 9–23)
BUN: 6 mg/dL (ref 6–20)
Bilirubin Total: <0.2 mg/dL (ref 0.0–1.2)
CO2: 17 mmol/L — ABNORMAL LOW (ref 20–29)
Calcium: 8.9 mg/dL (ref 8.7–10.2)
Chloride: 109 mmol/L — ABNORMAL HIGH (ref 96–106)
Creatinine, Ser: 0.61 mg/dL (ref 0.57–1.00)
Globulin, Total: 2.4 g/dL (ref 1.5–4.5)
Glucose: 101 mg/dL — ABNORMAL HIGH (ref 70–99)
Potassium: 4.3 mmol/L (ref 3.5–5.2)
Sodium: 142 mmol/L (ref 134–144)
Total Protein: 5.9 g/dL — ABNORMAL LOW (ref 6.0–8.5)
eGFR: 119 mL/min/{1.73_m2} (ref >59)

## 2021-11-19 LAB — PROTEIN / CREATININE RATIO, URINE
Creatinine, Urine: 74.1 mg/dL
Protein, Ur: 34.4 mg/dL
Protein/Creat Ratio: 464 mg/g creat — ABNORMAL HIGH (ref 0–200)

## 2021-11-20 LAB — CBC/D/PLT+RPR+RH+ABO+RUBIGG...
Antibody Screen: NEGATIVE
Basophils Absolute: 0 10*3/uL (ref 0.0–0.2)
Basos: 0 %
EOS (ABSOLUTE): 0.2 10*3/uL (ref 0.0–0.4)
Eos: 2 %
HCV Ab: NONREACTIVE
HIV Screen 4th Generation wRfx: NONREACTIVE
Hematocrit: 37.5 % (ref 34.0–46.6)
Hemoglobin: 12.1 g/dL (ref 11.1–15.9)
Hepatitis B Surface Ag: NEGATIVE
Immature Grans (Abs): 0.1 10*3/uL (ref 0.0–0.1)
Immature Granulocytes: 1 %
Lymphocytes Absolute: 2.8 10*3/uL (ref 0.7–3.1)
Lymphs: 24 %
MCH: 24.2 pg — ABNORMAL LOW (ref 26.6–33.0)
MCHC: 32.3 g/dL (ref 31.5–35.7)
MCV: 75 fL — ABNORMAL LOW (ref 79–97)
Monocytes Absolute: 0.6 10*3/uL (ref 0.1–0.9)
Monocytes: 5 %
Neutrophils Absolute: 7.9 10*3/uL — ABNORMAL HIGH (ref 1.4–7.0)
Neutrophils: 68 %
Platelets: 469 10*3/uL — ABNORMAL HIGH (ref 150–450)
RBC: 4.99 x10E6/uL (ref 3.77–5.28)
RDW: 20.9 % — ABNORMAL HIGH (ref 11.7–15.4)
RPR Ser Ql: NONREACTIVE
Rh Factor: POSITIVE
Rubella Antibodies, IGG: 2.13 index (ref >0.99)
WBC: 11.6 10*3/uL — ABNORMAL HIGH (ref 3.4–10.8)

## 2021-11-20 LAB — AFP, SERUM, OPEN SPINA BIFIDA
AFP MoM: 1.19
AFP Value: 53.9 ng/mL
Gest. Age on Collection Date: 19 weeks
Maternal Age At EDD: 35.8 yr
OSBR Risk 1 IN: 10000
Test Results:: NEGATIVE
Weight: 213 [lb_av]

## 2021-11-20 LAB — HCV INTERPRETATION

## 2021-11-21 LAB — CYTOLOGY - PAP
Chlamydia: NEGATIVE
Comment: NEGATIVE
Comment: NEGATIVE
Comment: NORMAL
Diagnosis: UNDETERMINED — AB
High risk HPV: NEGATIVE
Neisseria Gonorrhea: NEGATIVE

## 2021-11-21 LAB — URINE CULTURE

## 2021-11-24 LAB — PANORAMA PRENATAL TEST FULL PANEL:PANORAMA TEST PLUS 5 ADDITIONAL MICRODELETIONS: FETAL FRACTION: 6.4

## 2021-11-25 ENCOUNTER — Encounter: Payer: Self-pay | Admitting: Women's Health

## 2021-11-25 DIAGNOSIS — R87619 Unspecified abnormal cytological findings in specimens from cervix uteri: Secondary | ICD-10-CM | POA: Insufficient documentation

## 2021-11-26 ENCOUNTER — Telehealth: Payer: Self-pay | Admitting: *Deleted

## 2021-11-26 NOTE — Telephone Encounter (Signed)
LMOVM for patient to check mychart message from [facility] regarding UTI. Advised to take all of the medication and to call back with any further questions.

## 2021-11-28 ENCOUNTER — Encounter: Payer: Self-pay | Admitting: *Deleted

## 2021-11-28 LAB — HORIZON CUSTOM: REPORT SUMMARY: POSITIVE — AB

## 2021-12-01 ENCOUNTER — Encounter: Payer: Self-pay | Admitting: Women's Health

## 2021-12-01 DIAGNOSIS — O285 Abnormal chromosomal and genetic finding on antenatal screening of mother: Secondary | ICD-10-CM | POA: Insufficient documentation

## 2021-12-04 ENCOUNTER — Encounter: Payer: Self-pay | Admitting: *Deleted

## 2021-12-08 ENCOUNTER — Encounter: Payer: Medicaid Other | Admitting: Women's Health

## 2021-12-08 ENCOUNTER — Other Ambulatory Visit: Payer: Medicaid Other

## 2021-12-09 ENCOUNTER — Encounter: Payer: Self-pay | Admitting: Women's Health

## 2021-12-19 ENCOUNTER — Other Ambulatory Visit: Payer: Self-pay | Admitting: Obstetrics & Gynecology

## 2021-12-19 DIAGNOSIS — Z362 Encounter for other antenatal screening follow-up: Secondary | ICD-10-CM

## 2021-12-22 ENCOUNTER — Other Ambulatory Visit: Payer: 59

## 2021-12-22 ENCOUNTER — Encounter: Payer: 59 | Admitting: Advanced Practice Midwife

## 2021-12-22 ENCOUNTER — Encounter: Payer: Self-pay | Admitting: Women's Health

## 2021-12-23 DIAGNOSIS — O99891 Other specified diseases and conditions complicating pregnancy: Secondary | ICD-10-CM | POA: Diagnosis not present

## 2021-12-23 DIAGNOSIS — R519 Headache, unspecified: Secondary | ICD-10-CM | POA: Diagnosis not present

## 2021-12-23 DIAGNOSIS — Z3A23 23 weeks gestation of pregnancy: Secondary | ICD-10-CM | POA: Diagnosis not present

## 2021-12-23 DIAGNOSIS — R109 Unspecified abdominal pain: Secondary | ICD-10-CM | POA: Diagnosis not present

## 2021-12-24 ENCOUNTER — Ambulatory Visit (INDEPENDENT_AMBULATORY_CARE_PROVIDER_SITE_OTHER): Payer: Medicaid Other | Admitting: Advanced Practice Midwife

## 2021-12-24 ENCOUNTER — Encounter: Payer: Self-pay | Admitting: Advanced Practice Midwife

## 2021-12-24 VITALS — BP 131/88 | HR 85 | Wt 222.4 lb

## 2021-12-24 DIAGNOSIS — Z3A24 24 weeks gestation of pregnancy: Secondary | ICD-10-CM

## 2021-12-24 DIAGNOSIS — O0990 Supervision of high risk pregnancy, unspecified, unspecified trimester: Secondary | ICD-10-CM

## 2021-12-24 DIAGNOSIS — O2342 Unspecified infection of urinary tract in pregnancy, second trimester: Secondary | ICD-10-CM

## 2021-12-24 NOTE — Progress Notes (Signed)
HIGH-RISK PREGNANCY VISIT Patient name: Stacie Oliver MRN [id number]  Date of birth: Nov 23, 1985 Chief Complaint:   Routine Prenatal Visit and High Risk Gestation ([facility] on 7-25 for bp up on pain in stomach)  History of Present Illness:   Stacie Oliver is a 36 y.o. G16P2002 female at [redacted]w[redacted]d with an Estimated Date of Delivery: 04/14/22 being seen today for ongoing management of a high-risk pregnancy complicated by hx DVT (on Lovenox 100mg  bid); cHTN (no meds).    Today she reports  having [facility] ED visit yesterday for H/A- feels better now; didn't finish Macrobid rx from June- asymptomatic; taking Lovenox bid mostly everyday . Contractions: Not present.  .  Movement: Present. denies leaking of fluid.      11/18/2021    2:56 PM 10/13/2021   10:54 AM  Depression screen PHQ 2/9  Decreased Interest 2 0  Down, Depressed, Hopeless 0 1  PHQ - 2 Score 2 1  Altered sleeping 1 0  Tired, decreased energy 1 1  Change in appetite 1 0  Feeling bad or failure about yourself  0 1  Trouble concentrating 0 0  Moving slowly or fidgety/restless 0 0  Suicidal thoughts 0 0  PHQ-9 Score 5 3        11/18/2021    2:58 PM 10/13/2021   10:54 AM  GAD 7 : Generalized Anxiety Score  Nervous, Anxious, on Edge 1 1  Control/stop worrying 1 1  Worry too much - different things 1 1  Trouble relaxing 0 0  Restless 0 0  Easily annoyed or irritable 1 1  Afraid - awful might happen 0 0  Total GAD 7 Score 4 4     Review of Systems:   Pertinent items are noted in HPI Denies abnormal vaginal discharge w/ itching/odor/irritation, headaches, visual changes, shortness of breath, chest pain, abdominal pain, severe nausea/vomiting, or problems with urination or bowel movements unless otherwise stated above. Pertinent History Reviewed:  Reviewed past medical,surgical, social, obstetrical and family history.  Reviewed problem list, medications and allergies. Physical Assessment:   Vitals:   12/24/21 1038   BP: 131/88  Pulse: 85  Weight: 222 lb 6.4 oz (100.9 kg)  Body mass index is 42.02 kg/m.           Physical Examination:   General appearance: alert, well appearing, and in no distress  Mental status: alert, oriented to person, place, and time  Skin: warm & dry   Extremities: Edema: Trace    Cardiovascular: normal heart rate noted  Respiratory: normal respiratory effort, no distress  Abdomen: gravid, soft, non-tender  Pelvic: Cervical exam deferred         Fetal Status: Fetal Heart Rate (bpm): 141 Fundal Height: 26 cm Movement: Present    Fetal Surveillance Testing today: doppler    No results found for this or any previous visit (from the past 24 hour(s)).  Assessment & Plan:  High-risk pregnancy: G3P2002 at [redacted]w[redacted]d with an Estimated Date of Delivery: 04/14/22   1) Hx DVT, better about taking Lovenox 100mg  bid  2) cHTN, no meds; taking bASA (had ^P/C ratio with UTI present; still with blood in urine 7/25 so won't repeat today)  3) Prev C/S x 2 (no vag deliveries), wants to labor if begins spontaneously, but open to rLTCS as well  4) Previous UTI, didn't complete Macrobid course; will culture today for sensitivities  Meds: No orders of the defined types were placed in this encounter.   Labs/procedures  today: none  Treatment Plan:  growth q 4wks if stays off antihypertensives  Reviewed: Preterm labor symptoms and general obstetric precautions including but not limited to vaginal bleeding, contractions, leaking of fluid and fetal movement were reviewed in detail with the patient.  All questions were answered. Does have home bp cuff. Office bp cuff given: not applicable. Check bp daily, let us know if consistently >140 and/or >90.  Follow-up: Return for HROB, PN2, in person- add to 8/10 u/s.   Future Appointments  Date Time Provider Department Center  01/08/2022  3:45 PM [facility] - [facility] [place] [facility] None    Orders Placed This Encounter  Procedures   Urine Culture    Arabella Merles CNM 12/24/2021 11:08 AM

## 2021-12-24 NOTE — Patient Instructions (Signed)
Mariana Kaufman, I greatly value your feedback.  If you receive a survey following your visit with [place] today, we appreciate you taking the time to fill it out.  Thanks, Philipp Deputy, CNM   You will have your sugar test next visit.  Please do not eat or drink anything after midnight the night before you come, not even water.  You will be here for at least two hours.  Please make an appointment online for the bloodwork at [email or web address] for 8:30am (or as close to this as possible). Make sure you select the [facility] service center. The day of the appointment, check in with our office first, then you will go to [facility] to start the sugar test.    [facility] HAS MOVED!!! It is now [facility] at [facility] ([address] [place], [place] [address]) Entrance C, located off of [address] valet parking  Go to [email or web address] to register for FREE online childbirth classes   Call the office [phone]) or go to [facility] if: You begin to have strong, frequent contractions Your water breaks.  Sometimes it is a big gush of fluid, sometimes it is just a trickle that keeps getting your panties wet or running down your legs You have vaginal bleeding.  It is normal to have a small amount of spotting if your cervix was checked.  You don't feel your baby moving like normal.  If you don't, get you something to eat and drink and lay down and focus on feeling your baby move.   If your baby is still not moving like normal, you should call the office or go to [facility].  [place] Pediatricians/Family Doctors: [facility] [phone]           [facility] [phone]                [facility] [phone] (usually not accepting new patients unless you have family there already, you are always welcome to call and ask)      [facility] Department [phone]       [facility] Pediatricians/Family Doctors:  [facility]: [phone] [facility]: [phone] [facility]: [phone]  [facility] Doctors:  [facility]: [phone]  [facility]: [phone]  [facility] Doctors: [facility]: [phone]   Home Blood Pressure Monitoring for Patients   Your provider has recommended that you check your blood pressure (BP) at least once a week at home. If you do not have a blood pressure cuff at home, one will be provided for you. Contact your provider if you have not received your monitor within 1 week.   Helpful Tips for Accurate Home Blood Pressure Checks  Don't smoke, exercise, or drink caffeine 30 minutes before checking your BP Use the restroom before checking your BP (a full bladder can raise your pressure) Relax in a comfortable upright chair Feet on the ground Left arm resting comfortably on a flat surface at the level of your heart Legs uncrossed Back supported Sit quietly and don't talk Place the cuff on your bare arm Adjust snuggly, so that only two fingertips can fit between your skin and the top of the cuff Check 2 readings separated by at least one minute Keep a log of your BP readings For a visual, please reference this diagram: http://ccnc.care/bpdiagram  Provider Name: [facility]     Phone: [phone]  Zone 1: ALL CLEAR  Continue to monitor your symptoms:  BP reading is less than 140 (top number) or less than 90 (bottom number)  No right upper stomach pain No headaches or seeing spots No feeling nauseated or throwing up No swelling in face and hands  Zone 2: CAUTION Call your doctor's office for any of the following:  BP reading is greater than 140 (top number) or greater than 90 (bottom number)  Stomach pain under your ribs in the middle or right side Headaches or seeing spots Feeling nauseated or throwing up Swelling in face and hands  Zone 3: EMERGENCY  Seek  immediate medical care if you have any of the following:  BP reading is greater than160 (top number) or greater than 110 (bottom number) Severe headaches not improving with Tylenol Serious difficulty catching your breath Any worsening symptoms from Zone 2   Second Trimester of Pregnancy The second trimester is from week 13 through week 28, months 4 through 6. The second trimester is often a time when you feel your best. Your body has also adjusted to being pregnant, and you begin to feel better physically. Usually, morning sickness has lessened or quit completely, you may have more energy, and you may have an increase in appetite. The second trimester is also a time when the fetus is growing rapidly. At the end of the sixth month, the fetus is about 9 inches long and weighs about 1 pounds. You will likely begin to feel the baby move (quickening) between 18 and 20 weeks of the pregnancy. BODY CHANGES Your body goes through many changes during pregnancy. The changes vary from woman to woman.  Your weight will continue to increase. You will notice your lower abdomen bulging out. You may begin to get stretch marks on your hips, abdomen, and breasts. You may develop headaches that can be relieved by medicines approved by your health care provider. You may urinate more often because the fetus is pressing on your bladder. You may develop or continue to have heartburn as a result of your pregnancy. You may develop constipation because certain hormones are causing the muscles that push waste through your intestines to slow down. You may develop hemorrhoids or swollen, bulging veins (varicose veins). You may have back pain because of the weight gain and pregnancy hormones relaxing your joints between the bones in your pelvis and as a result of a shift in weight and the muscles that support your balance. Your breasts will continue to grow and be tender. Your gums may bleed and may be sensitive to brushing  and flossing. Dark spots or blotches (chloasma, mask of pregnancy) may develop on your face. This will likely fade after the baby is born. A dark line from your belly button to the pubic area (linea nigra) may appear. This will likely fade after the baby is born. You may have changes in your hair. These can include thickening of your hair, rapid growth, and changes in texture. Some women also have hair loss during or after pregnancy, or hair that feels dry or thin. Your hair will most likely return to normal after your baby is born. WHAT TO EXPECT AT YOUR PRENATAL VISITS During a routine prenatal visit: You will be weighed to make sure you and the fetus are growing normally. Your blood pressure will be taken. Your abdomen will be measured to track your baby's growth. The fetal heartbeat will be listened to. Any test results from the previous visit will be discussed. Your health care provider  may ask you: How you are feeling. If you are feeling the baby move. If you have had any abnormal symptoms, such as leaking fluid, bleeding, severe headaches, or abdominal cramping. If you have any questions. Other tests that may be performed during your second trimester include: Blood tests that check for: Low iron levels (anemia). Gestational diabetes (between 24 and 28 weeks). Rh antibodies. Urine tests to check for infections, diabetes, or protein in the urine. An ultrasound to confirm the proper growth and development of the baby. An amniocentesis to check for possible genetic problems. Fetal screens for spina bifida and Down syndrome. HOME CARE INSTRUCTIONS  Avoid all smoking, herbs, alcohol, and unprescribed drugs. These chemicals affect the formation and growth of the baby. Follow your health care provider's instructions regarding medicine use. There are medicines that are either safe or unsafe to take during pregnancy. Exercise only as directed by your health care provider. Experiencing  uterine cramps is a good sign to stop exercising. Continue to eat regular, healthy meals. Wear a good support bra for breast tenderness. Do not use hot tubs, steam rooms, or saunas. Wear your seat belt at all times when driving. Avoid raw meat, uncooked cheese, cat litter boxes, and soil used by cats. These carry germs that can cause birth defects in the baby. Take your prenatal vitamins. Try taking a stool softener (if your health care provider approves) if you develop constipation. Eat more high-fiber foods, such as fresh vegetables or fruit and whole grains. Drink plenty of fluids to keep your urine clear or pale yellow. Take warm sitz baths to soothe any pain or discomfort caused by hemorrhoids. Use hemorrhoid cream if your health care provider approves. If you develop varicose veins, wear support hose. Elevate your feet for 15 minutes, 3-4 times a day. Limit salt in your diet. Avoid heavy lifting, wear low heel shoes, and practice good posture. Rest with your legs elevated if you have leg cramps or low back pain. Visit your dentist if you have not gone yet during your pregnancy. Use a soft toothbrush to brush your teeth and be gentle when you floss. A sexual relationship may be continued unless your health care provider directs you otherwise. Continue to go to all your prenatal visits as directed by your health care provider. SEEK MEDICAL CARE IF:  You have dizziness. You have mild pelvic cramps, pelvic pressure, or nagging pain in the abdominal area. You have persistent nausea, vomiting, or diarrhea. You have a bad smelling vaginal discharge. You have pain with urination. SEEK IMMEDIATE MEDICAL CARE IF:  You have a fever. You are leaking fluid from your vagina. You have spotting or bleeding from your vagina. You have severe abdominal cramping or pain. You have rapid weight gain or loss. You have shortness of breath with chest pain. You notice sudden or extreme swelling of your face,  hands, ankles, feet, or legs. You have not felt your baby move in over an hour. You have severe headaches that do not go away with medicine. You have vision changes. Document Released: 05/12/2001 Document Revised: 05/23/2013 Document Reviewed: 07/19/2012 [facility] Patient Information 2015 [place], [place]. This information is not intended to replace advice given to you by your health care provider. Make sure you discuss any questions you have with your health care provider.

## 2021-12-26 LAB — URINE CULTURE

## 2022-01-08 ENCOUNTER — Ambulatory Visit (INDEPENDENT_AMBULATORY_CARE_PROVIDER_SITE_OTHER): Payer: 59 | Admitting: Advanced Practice Midwife

## 2022-01-08 ENCOUNTER — Encounter: Payer: Self-pay | Admitting: Advanced Practice Midwife

## 2022-01-08 ENCOUNTER — Ambulatory Visit (INDEPENDENT_AMBULATORY_CARE_PROVIDER_SITE_OTHER): Payer: 59

## 2022-01-08 ENCOUNTER — Other Ambulatory Visit: Payer: 59

## 2022-01-08 VITALS — BP 143/99 | HR 73 | Wt 228.0 lb

## 2022-01-08 DIAGNOSIS — O0990 Supervision of high risk pregnancy, unspecified, unspecified trimester: Secondary | ICD-10-CM

## 2022-01-08 DIAGNOSIS — Z362 Encounter for other antenatal screening follow-up: Secondary | ICD-10-CM

## 2022-01-08 DIAGNOSIS — Z3A26 26 weeks gestation of pregnancy: Secondary | ICD-10-CM | POA: Diagnosis not present

## 2022-01-08 DIAGNOSIS — Z658 Other specified problems related to psychosocial circumstances: Secondary | ICD-10-CM

## 2022-01-08 DIAGNOSIS — Z131 Encounter for screening for diabetes mellitus: Secondary | ICD-10-CM | POA: Diagnosis not present

## 2022-01-08 DIAGNOSIS — Z86718 Personal history of other venous thrombosis and embolism: Secondary | ICD-10-CM

## 2022-01-08 DIAGNOSIS — O10919 Unspecified pre-existing hypertension complicating pregnancy, unspecified trimester: Secondary | ICD-10-CM

## 2022-01-08 DIAGNOSIS — O285 Abnormal chromosomal and genetic finding on antenatal screening of mother: Secondary | ICD-10-CM

## 2022-01-08 LAB — POCT URINALYSIS DIPSTICK OB
Blood, UA: NEGATIVE
Glucose, UA: NEGATIVE
Ketones, UA: NEGATIVE
Leukocytes, UA: NEGATIVE
Nitrite, UA: NEGATIVE
POC,PROTEIN,UA: NEGATIVE

## 2022-01-08 MED ORDER — LABETALOL HCL 100 MG PO TABS: 100.0000 mg | ORAL_TABLET | Freq: Two times a day (BID) | ORAL | 6 refills | Status: DC

## 2022-01-08 MED ORDER — BLOOD PRESSURE MONITOR AUTOMAT DEVI: 0 refills | Status: AC

## 2022-01-08 NOTE — Patient Instructions (Addendum)
Stacie Oliver, I greatly value your feedback.  If you receive a survey following your visit with [place] today, we appreciate you taking the time to fill it out.  Thanks, Stacie Beams, DNP, CNM  [facility] HAS MOVED!!! It is now [facility] Children[facility] at [facility] ([address] [place], [place] [address]) Entrance located off of [address] Free 24/7 valet parking   Go to [email or web address] to register for FREE online childbirth classes    Call the office [phone]) or go to [facility] if: You begin to have strong, frequent contractions Your water breaks.  Sometimes it is a big gush of fluid, sometimes it is just a trickle that keeps getting your panties wet or running down your legs You have vaginal bleeding.  It is normal to have a small amount of spotting if your cervix was checked.  You don't feel your baby moving like normal.  If you don't, get you something to eat and drink and lay down and focus on feeling your baby move.  You should feel at least 10 movements in 2 hours.  If you don't, you should call the office or go to [facility].   Home Blood Pressure Monitoring for Patients   Your provider has recommended that you check your blood pressure (BP) at least once a week at home. If you do not have a blood pressure cuff at home, one will be provided for you. Contact your provider if you have not received your monitor within 1 week.   Helpful Tips for Accurate Home Blood Pressure Checks  Don't smoke, exercise, or drink caffeine 30 minutes before checking your BP Use the restroom before checking your BP (a full bladder can raise your pressure) Relax in a comfortable upright chair Feet on the ground Left arm resting comfortably on a flat surface at the level of your heart Legs uncrossed Back supported Sit quietly and don't talk Place the cuff on your bare arm Adjust snuggly, so that only two fingertips can fit between your skin and the top  of the cuff Check 2 readings separated by at least one minute Keep a log of your BP readings For a visual, please reference this diagram: http://ccnc.care/bpdiagram  [phone]  (Jaime--behavioral health specialist)  call for an appointment    Provider Name: [facility]     Phone: [phone]  Zone 1: ALL CLEAR  Continue to monitor your symptoms:  BP reading is less than 140 (top number) or less than 90 (bottom number)  No right upper stomach pain No headaches or seeing spots No feeling nauseated or throwing up No swelling in face and hands  Zone 2: CAUTION Call your doctor's office for any of the following:  BP reading is greater than 140 (top number) or greater than 90 (bottom number)  Stomach pain under your ribs in the middle or right side Headaches or seeing spots Feeling nauseated or throwing up Swelling in face and hands  Zone 3: EMERGENCY  Seek immediate medical care if you have any of the following:  BP reading is greater than160 (top number) or greater than 110 (bottom number) Severe headaches not improving with Tylenol Serious difficulty catching your breath Any worsening symptoms from Zone 2
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
## 2022-01-08 NOTE — Addendum Note (Signed)
Addended by: Jacklyn Shell on: 01/08/2022 03:12 PM   Modules accepted: Orders

## 2022-01-08 NOTE — Progress Notes (Signed)
[place] 26+2 wks,cephalic,CX 3.9 cm,anterior placenta gr 1,SVP 6.2 cm,FHR 124 bpm,EFW 956 g 51%,anatomy of the heart complete,no obvious abnormalities

## 2022-01-09 LAB — CBC
Hematocrit: 37.4 % (ref 34.0–46.6)
Hemoglobin: 12.5 g/dL (ref 11.1–15.9)
MCH: 26.1 pg — ABNORMAL LOW (ref 26.6–33.0)
MCHC: 33.4 g/dL (ref 31.5–35.7)
MCV: 78 fL — ABNORMAL LOW (ref 79–97)
Platelets: 457 10*3/uL — ABNORMAL HIGH (ref 150–450)
RBC: 4.79 x10E6/uL (ref 3.77–5.28)
RDW: 13.8 % (ref 11.7–15.4)
WBC: 12.1 10*3/uL — ABNORMAL HIGH (ref 3.4–10.8)

## 2022-01-09 LAB — PROTEIN / CREATININE RATIO, URINE
Creatinine, Urine: 73.8 mg/dL
Protein, Ur: 11.2 mg/dL
Protein/Creat Ratio: 152 mg/g creat (ref 0–200)

## 2022-01-09 LAB — GLUCOSE TOLERANCE, 2 HOURS W/ 1HR
Glucose, 1 hour: 109 mg/dL (ref 70–179)
Glucose, 2 hour: 71 mg/dL (ref 70–152)
Glucose, Fasting: 77 mg/dL (ref 70–91)

## 2022-01-09 LAB — HIV ANTIBODY (ROUTINE TESTING W REFLEX): HIV Screen 4th Generation wRfx: NONREACTIVE

## 2022-01-09 LAB — RPR: RPR Ser Ql: NONREACTIVE

## 2022-01-09 LAB — ANTIBODY SCREEN: Antibody Screen: NEGATIVE

## 2022-01-12 ENCOUNTER — Telehealth: Payer: Self-pay | Admitting: Clinical

## 2022-01-12 NOTE — Telephone Encounter (Signed)
Attempt call regarding referral; Unable to leave message as mailbox is full.  

## 2022-02-09 ENCOUNTER — Encounter: Payer: 59 | Admitting: Obstetrics & Gynecology

## 2022-02-09 ENCOUNTER — Other Ambulatory Visit: Payer: 59

## 2022-02-23 ENCOUNTER — Encounter: Payer: 59 | Admitting: Obstetrics & Gynecology

## 2022-02-24 ENCOUNTER — Other Ambulatory Visit: Payer: Self-pay | Admitting: Advanced Practice Midwife

## 2022-02-24 ENCOUNTER — Encounter: Payer: Self-pay | Admitting: Obstetrics & Gynecology

## 2022-02-24 ENCOUNTER — Ambulatory Visit (INDEPENDENT_AMBULATORY_CARE_PROVIDER_SITE_OTHER): Payer: 59

## 2022-02-24 ENCOUNTER — Ambulatory Visit (INDEPENDENT_AMBULATORY_CARE_PROVIDER_SITE_OTHER): Payer: 59 | Admitting: Obstetrics & Gynecology

## 2022-02-24 DIAGNOSIS — O0990 Supervision of high risk pregnancy, unspecified, unspecified trimester: Secondary | ICD-10-CM

## 2022-02-24 DIAGNOSIS — Z3A33 33 weeks gestation of pregnancy: Secondary | ICD-10-CM | POA: Diagnosis not present

## 2022-02-24 DIAGNOSIS — Z86718 Personal history of other venous thrombosis and embolism: Secondary | ICD-10-CM

## 2022-02-24 DIAGNOSIS — O10919 Unspecified pre-existing hypertension complicating pregnancy, unspecified trimester: Secondary | ICD-10-CM

## 2022-02-24 DIAGNOSIS — O364XX0 Maternal care for intrauterine death, not applicable or unspecified: Secondary | ICD-10-CM

## 2022-02-24 DIAGNOSIS — Z98891 History of uterine scar from previous surgery: Secondary | ICD-10-CM

## 2022-02-24 NOTE — Progress Notes (Signed)
[place] PREGNANCY VISIT Patient name: Stacie Oliver MRN [id number]  Date of birth: [birth year] Chief Complaint:   Routine Prenatal Visit  History of Present Illness:   CHAYCE RULLO is a 36 y.o. G32P2002 female at [redacted]w[redacted]d with an Estimated Date of Delivery: 04/14/22 being seen today for ongoing management of a high-risk pregnancy complicated by fetal loss diagnosed on today's visit with  Hx of DVT on therapeutic lovenox, CHTN on labetalol + ASA, previous C section x 2.    Today she reports no complaints.  .  .   . denies leaking of fluid.  Denies any bleeding Did have an episode 3 or 4 days ago of some abdominal pain that resolved, was on a trucking job in [place]     01/08/2022    9:12 AM 11/18/2021    2:56 PM 10/13/2021   10:54 AM  Depression screen PHQ 2/9  Decreased Interest 1 2 0  Down, Depressed, Hopeless 1 0 1  PHQ - 2 Score 2 2 1   Altered sleeping 0 1 0  Tired, decreased energy 1 1 1   Change in appetite 3 1 0  Feeling bad or failure about yourself  2 0 1  Trouble concentrating 0 0 0  Moving slowly or fidgety/restless 0 0 0  Suicidal thoughts 1 0 0  PHQ-9 Score 9 5 3         01/08/2022    9:12 AM 11/18/2021    2:58 PM 10/13/2021   10:54 AM  GAD 7 : Generalized Anxiety Score  Nervous, Anxious, on Edge 0 1 1  Control/stop worrying 1 1 1   Worry too much - different things 1 1 1   Trouble relaxing 0 0 0  Restless 0 0 0  Easily annoyed or irritable 1 1 1   Afraid - awful might happen 0 0 0  Total GAD 7 Score 3 4 4      Review of Systems:   Pertinent items are noted in HPI Denies abnormal vaginal discharge w/ itching/odor/irritation, headaches, visual changes, shortness of breath, chest pain, abdominal pain, severe nausea/vomiting, or problems with urination or bowel movements unless otherwise stated above. Pertinent History Reviewed:  Reviewed past medical,surgical, social, obstetrical and family history.  Reviewed problem list, medications and  allergies. Physical Assessment:  There were no vitals filed for this visit.There is no height or weight on file to calculate BMI.           Physical Examination:   General appearance: alert, well appearing, and in no distress  Mental status: alert, oriented to person, place, and time  Skin: warm & dry   Extremities:      Cardiovascular: normal heart rate noted  Respiratory: normal respiratory effort, no distress  Abdomen: gravid, soft, non-tender  Pelvic: Cervical exam deferred         Fetal Status: fetal loss  Fetal Surveillance Testing today: sonogram   Chaperone: N/A    No results found for this or any previous visit (from the past 24 hour(s)).  Assessment & Plan:  High-risk pregnancy: G3P2002 at [redacted]w[redacted]d with an Estimated Date of Delivery: 04/14/22      ICD-10-CM   1. Supervision of high risk pregnancy, antepartum  O09.90     2. Fetal demise, [redacted]w[redacted]d, diagnosed today 02/24/22  O36.4XX0    fetus [redacted]w[redacted]d with small fluid collection noted behind the placenta at fundal egde, clinically no bleeding    3. History of DVT (deep vein thrombosis), on lovenox 100 mg BID  Z86.718  4. Chronic hypertension affecting pregnancy  O10.919    Labetalol 100 BID with good control    5. History of 2 cesarean sections  Z98.891         Meds: No orders of the defined types were placed in this encounter.   Orders: No orders of the defined types were placed in this encounter.    Labs/procedures today: U/S  Treatment Plan:  Fetal loss now at [redacted]w[redacted]d, measures [redacted]w[redacted]d, discussed with patient and FOB, all questions were answered, recommended stopping the lovenox(last dose last night) in anticipation of delivery(mode uncertain), will follow up in the office 02/26/22 at 10am for triage and disposition    Follow-up: Return in about 2 days (around 02/26/2022) for at 10 am with Dr Elonda Husky.   No future appointments.  No orders of the defined types were placed in this encounter.  Florian Buff   Attending Physician for the Center for [facility] 02/24/2022 12:16 PM

## 2022-02-24 NOTE — Progress Notes (Signed)
[place] 33 wks(by LMP),fetal demise,cephalic,6.9 x 2.1 cm retroplacental abruption,anterior placenta gr 2,no fetal heart tones,AFI 11 cm,EFW 1572 g=30+4 wks,Dr Elonda Husky scanned and discussed results with patient

## 2022-02-26 ENCOUNTER — Ambulatory Visit (INDEPENDENT_AMBULATORY_CARE_PROVIDER_SITE_OTHER): Payer: 59 | Admitting: Obstetrics & Gynecology

## 2022-02-26 ENCOUNTER — Telehealth: Payer: Self-pay | Admitting: Obstetrics & Gynecology

## 2022-02-26 VITALS — BP 186/115 | HR 100

## 2022-02-26 DIAGNOSIS — O364XX0 Maternal care for intrauterine death, not applicable or unspecified: Secondary | ICD-10-CM

## 2022-02-26 DIAGNOSIS — Z98891 History of uterine scar from previous surgery: Secondary | ICD-10-CM

## 2022-02-26 DIAGNOSIS — O10919 Unspecified pre-existing hypertension complicating pregnancy, unspecified trimester: Secondary | ICD-10-CM

## 2022-02-26 DIAGNOSIS — Z86718 Personal history of other venous thrombosis and embolism: Secondary | ICD-10-CM

## 2022-02-26 NOTE — Telephone Encounter (Signed)
Note given to patient from Dr Elonda Husky.

## 2022-02-26 NOTE — Telephone Encounter (Signed)
Pt is here needing a work note for the upcoming hospital stay and after. Please advise

## 2022-02-27 ENCOUNTER — Other Ambulatory Visit: Payer: Self-pay

## 2022-02-27 ENCOUNTER — Inpatient Hospital Stay (HOSPITAL_COMMUNITY): Payer: 59 | Admitting: Anesthesiology

## 2022-02-27 ENCOUNTER — Encounter (HOSPITAL_COMMUNITY): Payer: Self-pay | Admitting: Obstetrics & Gynecology

## 2022-02-27 ENCOUNTER — Other Ambulatory Visit: Payer: Self-pay | Admitting: Obstetrics & Gynecology

## 2022-02-27 ENCOUNTER — Inpatient Hospital Stay (HOSPITAL_COMMUNITY)
Admission: AD | Admit: 2022-02-27 | Discharge: 2022-03-03 | DRG: 806 | Disposition: A | Discharge status: Home / Self Care | Payer: 59 | Attending: Obstetrics and Gynecology | Admitting: Obstetrics and Gynecology

## 2022-02-27 DIAGNOSIS — Z3A33 33 weeks gestation of pregnancy: Secondary | ICD-10-CM

## 2022-02-27 DIAGNOSIS — O09523 Supervision of elderly multigravida, third trimester: Secondary | ICD-10-CM | POA: Diagnosis not present

## 2022-02-27 DIAGNOSIS — O10919 Unspecified pre-existing hypertension complicating pregnancy, unspecified trimester: Secondary | ICD-10-CM | POA: Diagnosis present

## 2022-02-27 DIAGNOSIS — O364XX0 Maternal care for intrauterine death, not applicable or unspecified: Principal | ICD-10-CM

## 2022-02-27 DIAGNOSIS — O114 Pre-existing hypertension with pre-eclampsia, complicating childbirth: Secondary | ICD-10-CM | POA: Diagnosis present

## 2022-02-27 DIAGNOSIS — F17210 Nicotine dependence, cigarettes, uncomplicated: Secondary | ICD-10-CM | POA: Diagnosis present

## 2022-02-27 DIAGNOSIS — R52 Pain, unspecified: Secondary | ICD-10-CM | POA: Diagnosis not present

## 2022-02-27 DIAGNOSIS — D573 Sickle-cell trait: Secondary | ICD-10-CM | POA: Diagnosis present

## 2022-02-27 DIAGNOSIS — O34219 Maternal care for unspecified type scar from previous cesarean delivery: Secondary | ICD-10-CM | POA: Diagnosis present

## 2022-02-27 DIAGNOSIS — O99214 Obesity complicating childbirth: Secondary | ICD-10-CM | POA: Diagnosis present

## 2022-02-27 DIAGNOSIS — O1002 Pre-existing essential hypertension complicating childbirth: Secondary | ICD-10-CM | POA: Diagnosis present

## 2022-02-27 DIAGNOSIS — O468X3 Other antepartum hemorrhage, third trimester: Secondary | ICD-10-CM | POA: Diagnosis not present

## 2022-02-27 DIAGNOSIS — O1414 Severe pre-eclampsia complicating childbirth: Secondary | ICD-10-CM | POA: Diagnosis not present

## 2022-02-27 DIAGNOSIS — Z98891 History of uterine scar from previous surgery: Secondary | ICD-10-CM

## 2022-02-27 DIAGNOSIS — O9902 Anemia complicating childbirth: Secondary | ICD-10-CM | POA: Diagnosis present

## 2022-02-27 DIAGNOSIS — O99334 Smoking (tobacco) complicating childbirth: Secondary | ICD-10-CM | POA: Diagnosis present

## 2022-02-27 DIAGNOSIS — Z86718 Personal history of other venous thrombosis and embolism: Secondary | ICD-10-CM | POA: Diagnosis not present

## 2022-02-27 DIAGNOSIS — O26613 Liver and biliary tract disorders in pregnancy, third trimester: Secondary | ICD-10-CM | POA: Diagnosis not present

## 2022-02-27 DIAGNOSIS — O164 Unspecified maternal hypertension, complicating childbirth: Secondary | ICD-10-CM | POA: Diagnosis not present

## 2022-02-27 DIAGNOSIS — O0990 Supervision of high risk pregnancy, unspecified, unspecified trimester: Secondary | ICD-10-CM

## 2022-02-27 DIAGNOSIS — O34211 Maternal care for low transverse scar from previous cesarean delivery: Secondary | ICD-10-CM | POA: Diagnosis not present

## 2022-02-27 LAB — CBC
HCT: 35.2 % — ABNORMAL LOW (ref 36.0–46.0)
Hemoglobin: 11.9 g/dL — ABNORMAL LOW (ref 12.0–15.0)
MCH: 27.4 pg (ref 26.0–34.0)
MCHC: 33.8 g/dL (ref 30.0–36.0)
MCV: 80.9 fL (ref 80.0–100.0)
Platelets: 365 10*3/uL (ref 150–400)
RBC: 4.35 MIL/uL (ref 3.87–5.11)
RDW: 14.2 % (ref 11.5–15.5)
WBC: 9.7 10*3/uL (ref 4.0–10.5)
nRBC: 0 % (ref 0.0–0.2)

## 2022-02-27 LAB — COMPREHENSIVE METABOLIC PANEL
ALT: 12 U/L (ref 0–44)
AST: 16 U/L (ref 15–41)
Albumin: 2.2 g/dL — ABNORMAL LOW (ref 3.5–5.0)
Alkaline Phosphatase: 93 U/L (ref 38–126)
Anion gap: 11 (ref 5–15)
BUN: 8 mg/dL (ref 6–20)
CO2: 22 mmol/L (ref 22–32)
Calcium: 8.7 mg/dL — ABNORMAL LOW (ref 8.9–10.3)
Chloride: 106 mmol/L (ref 98–111)
Creatinine, Ser: 0.9 mg/dL (ref 0.44–1.00)
GFR, Estimated: >60 mL/min (ref >60)
Glucose, Bld: 95 mg/dL (ref 70–99)
Potassium: 3.4 mmol/L — ABNORMAL LOW (ref 3.5–5.1)
Sodium: 139 mmol/L (ref 135–145)
Total Bilirubin: <0.1 mg/dL — ABNORMAL LOW (ref 0.3–1.2)
Total Protein: 5.5 g/dL — ABNORMAL LOW (ref 6.5–8.1)

## 2022-02-27 LAB — DIC (DISSEMINATED INTRAVASCULAR COAGULATION)PANEL
D-Dimer, Quant: 1.43 ug/mL-FEU — ABNORMAL HIGH (ref 0.00–0.50)
Fibrinogen: 671 mg/dL — ABNORMAL HIGH (ref 210–475)
INR: 1 (ref 0.8–1.2)
Platelets: 334 10*3/uL (ref 150–400)
Prothrombin Time: 12.6 seconds (ref 11.4–15.2)
Smear Review: NONE SEEN
aPTT: 26 seconds (ref 24–36)

## 2022-02-27 LAB — RAPID URINE DRUG SCREEN, HOSP PERFORMED
Amphetamines: NOT DETECTED
Barbiturates: NOT DETECTED
Benzodiazepines: NOT DETECTED
Cocaine: NOT DETECTED
Opiates: NOT DETECTED
Tetrahydrocannabinol: POSITIVE — AB

## 2022-02-27 LAB — PROTEIN / CREATININE RATIO, URINE
Creatinine, Urine: 147 mg/dL
Protein Creatinine Ratio: 1.41 mg/mg{Cre} — ABNORMAL HIGH (ref 0.00–0.15)
Total Protein, Urine: 207 mg/dL

## 2022-02-27 LAB — RAPID HIV SCREEN (HIV 1/2 AB+AG)
HIV 1/2 Antibodies: NONREACTIVE
HIV-1 P24 Antigen - HIV24: NONREACTIVE

## 2022-02-27 LAB — TYPE AND SCREEN
ABO/RH(D): O POS
Antibody Screen: NEGATIVE

## 2022-02-27 MED ORDER — LABETALOL HCL 5 MG/ML IV SOLN
20.0000 mg | INTRAVENOUS | Status: DC | PRN
  Administered 2022-02-27: 20 mg via INTRAVENOUS

## 2022-02-27 MED ORDER — LABETALOL HCL 5 MG/ML IV SOLN
INTRAVENOUS | Status: AC
  Filled 2022-02-27: qty 8

## 2022-02-27 MED ORDER — SOD CITRATE-CITRIC ACID 500-334 MG/5ML PO SOLN: 30.0000 mL | ORAL | Status: DC | PRN

## 2022-02-27 MED ORDER — MISOPROSTOL 50MCG HALF TABLET
50.0000 ug | ORAL_TABLET | ORAL | Status: AC | PRN
  Administered 2022-02-27 – 2022-02-28 (×3): 50 ug via ORAL
  Filled 2022-02-27 (×3): qty 1

## 2022-02-27 MED ORDER — PHENYLEPHRINE 80 MCG/ML (10ML) SYRINGE FOR IV PUSH (FOR BLOOD PRESSURE SUPPORT): 80.0000 ug | PREFILLED_SYRINGE | INTRAVENOUS | Status: DC | PRN

## 2022-02-27 MED ORDER — LACTATED RINGERS IV SOLN: 500.0000 mL | Freq: Once | INTRAVENOUS | Status: DC

## 2022-02-27 MED ORDER — OXYTOCIN BOLUS FROM INFUSION
333.0000 mL | Freq: Once | INTRAVENOUS | Status: AC
  Administered 2022-03-02: 333 mL via INTRAVENOUS

## 2022-02-27 MED ORDER — FENTANYL CITRATE (PF) 100 MCG/2ML IJ SOLN: 50.0000 ug | INTRAMUSCULAR | Status: DC | PRN

## 2022-02-27 MED ORDER — OXYTOCIN-SODIUM CHLORIDE 30-0.9 UT/500ML-% IV SOLN
2.5000 [IU]/h | INTRAVENOUS | Status: DC
  Filled 2022-02-27: qty 500

## 2022-02-27 MED ORDER — OXYCODONE-ACETAMINOPHEN 5-325 MG PO TABS: 2.0000 | ORAL_TABLET | ORAL | Status: DC | PRN

## 2022-02-27 MED ORDER — LIDOCAINE HCL (PF) 1 % IJ SOLN
INTRAMUSCULAR | Status: DC | PRN
  Administered 2022-02-27: 6 mL via EPIDURAL
  Administered 2022-02-27: 4 mL via EPIDURAL

## 2022-02-27 MED ORDER — ONDANSETRON HCL 4 MG/2ML IJ SOLN: 4.0000 mg | Freq: Four times a day (QID) | INTRAMUSCULAR | Status: DC | PRN

## 2022-02-27 MED ORDER — ZOLPIDEM TARTRATE 5 MG PO TABS: 5.0000 mg | ORAL_TABLET | Freq: Every evening | ORAL | Status: DC | PRN

## 2022-02-27 MED ORDER — OXYCODONE-ACETAMINOPHEN 5-325 MG PO TABS: 1.0000 | ORAL_TABLET | ORAL | Status: DC | PRN

## 2022-02-27 MED ORDER — LABETALOL HCL 5 MG/ML IV SOLN
INTRAVENOUS | Status: AC
  Filled 2022-02-27: qty 4

## 2022-02-27 MED ORDER — HYDRALAZINE HCL 20 MG/ML IJ SOLN
10.0000 mg | INTRAMUSCULAR | Status: DC | PRN
  Administered 2022-02-27: 10 mg via INTRAVENOUS
  Filled 2022-02-27: qty 1

## 2022-02-27 MED ORDER — EPHEDRINE 5 MG/ML INJ: 10.0000 mg | INTRAVENOUS | Status: DC | PRN

## 2022-02-27 MED ORDER — LACTATED RINGERS IV SOLN
500.0000 mL | INTRAVENOUS | Status: DC | PRN
  Administered 2022-03-01: 250 mL via INTRAVENOUS

## 2022-02-27 MED ORDER — DIPHENHYDRAMINE HCL 50 MG/ML IJ SOLN
12.5000 mg | INTRAMUSCULAR | Status: AC | PRN
  Administered 2022-02-28 – 2022-03-02 (×3): 12.5 mg via INTRAVENOUS
  Filled 2022-02-27 (×3): qty 1

## 2022-02-27 MED ORDER — LACTATED RINGERS IV SOLN
INTRAVENOUS | Status: DC
  Administered 2022-03-01: 75 mL/h via INTRAVENOUS

## 2022-02-27 MED ORDER — LABETALOL HCL 5 MG/ML IV SOLN
80.0000 mg | INTRAVENOUS | Status: DC | PRN
  Administered 2022-02-27: 80 mg via INTRAVENOUS
  Filled 2022-02-27: qty 16

## 2022-02-27 MED ORDER — NIFEDIPINE ER OSMOTIC RELEASE 60 MG PO TB24
60.0000 mg | ORAL_TABLET | Freq: Every day | ORAL | Status: DC
  Administered 2022-02-27 – 2022-03-03 (×5): 60 mg via ORAL
  Filled 2022-02-27 (×6): qty 1

## 2022-02-27 MED ORDER — LIDOCAINE HCL (PF) 1 % IJ SOLN: 30.0000 mL | INTRAMUSCULAR | Status: DC | PRN

## 2022-02-27 MED ORDER — FENTANYL-BUPIVACAINE-NACL 0.5-0.125-0.9 MG/250ML-% EP SOLN
12.0000 mL/h | EPIDURAL | Status: DC | PRN
  Administered 2022-02-27 – 2022-03-02 (×4): 12 mL/h via EPIDURAL
  Filled 2022-02-27 (×4): qty 250

## 2022-02-27 MED ORDER — LABETALOL HCL 5 MG/ML IV SOLN
40.0000 mg | INTRAVENOUS | Status: DC | PRN
  Administered 2022-02-27: 40 mg via INTRAVENOUS

## 2022-02-27 MED ORDER — ACETAMINOPHEN 325 MG PO TABS
650.0000 mg | ORAL_TABLET | ORAL | Status: DC | PRN
  Administered 2022-02-28 (×2): 650 mg via ORAL
  Filled 2022-02-27 (×2): qty 2

## 2022-02-27 MED ORDER — BUTALBITAL-APAP-CAFFEINE 50-325-40 MG PO TABS
2.0000 | ORAL_TABLET | Freq: Once | ORAL | Status: AC
  Administered 2022-02-27: 2 via ORAL
  Filled 2022-02-27: qty 2

## 2022-02-27 MED ORDER — OXYTOCIN-SODIUM CHLORIDE 30-0.9 UT/500ML-% IV SOLN
1.0000 m[IU]/min | INTRAVENOUS | Status: DC
  Administered 2022-02-28: 2 m[IU]/min via INTRAVENOUS
  Filled 2022-02-27: qty 500

## 2022-02-27 MED ORDER — LABETALOL HCL 100 MG PO TABS
200.0000 mg | ORAL_TABLET | Freq: Once | ORAL | Status: AC
  Administered 2022-02-27: 200 mg via ORAL
  Filled 2022-02-27: qty 2

## 2022-02-27 NOTE — Progress Notes (Addendum)
Labor Progress Note AVYONNA WAGONER is a 37 y.o. G3P2002 at [redacted]w[redacted]d presented for IOL due to IUFD. S: Patient is resting comfortably, epidural in place.  O:  BP (!) 157/88   Pulse 87   Temp 98.2 F (36.8 C) (Oral)   Resp 17   Ht 5\' 1"  (1.549 m)   Wt 105.2 kg   LMP 07/08/2021 (Approximate)   SpO2 99%   BMI 43.84 kg/m  EFM: IUFD  CVE: Dilation: Closed Effacement (%): Thick Exam by:: Dr. Dorena Cookey   A&P: 36 y.o. V6P0141 [redacted]w[redacted]d for IOL due to IUFD. #Labor: Progressing well. Starting induction with 58mcg Cytotec oral despite TOLAC per Dr. Elonda Husky. Recheck in 4 hours to consider Foley balloon placement. #Pain: Epidural #FWB: IUFD #GBS  unknown #cHTN: nifedipine 74m qd, prnIV labetolol for SBP>160, DBP>105, protein creatinine ratio 1.41 (prior .464) patient meets criteria for SIPE.  Salvadore Oxford, MD, PGY-1 [facility] 11:12 PM 02/27/2022

## 2022-02-27 NOTE — Progress Notes (Signed)
[place] PREGNANCY VISIT Patient name: Stacie Oliver MRN [id number]  Date of birth: [birth year] Chief Complaint:   Follow-up  History of Present Illness:   Stacie Oliver is a 36 y.o. G61P2002 female at [redacted]w[redacted]d with an Estimated Date of Delivery: 04/14/22 being seen today for ongoing management of a high-risk pregnancy complicated by fetal loss at 33 weeks, chronic hypertension on labetalol, DVT on lovenox, previous C section x 2.    Today she reports no complaints. Contractions: Not present. Vag. Bleeding: None.  Movement: Absent. denies leaking of fluid.      01/08/2022    9:12 AM 11/18/2021    2:56 PM 10/13/2021   10:54 AM  Depression screen PHQ 2/9  Decreased Interest 1 2 0  Down, Depressed, Hopeless 1 0 1  PHQ - 2 Score 2 2 1   Altered sleeping 0 1 0  Tired, decreased energy 1 1 1   Change in appetite 3 1 0  Feeling bad or failure about yourself  2 0 1  Trouble concentrating 0 0 0  Moving slowly or fidgety/restless 0 0 0  Suicidal thoughts 1 0 0  PHQ-9 Score 9 5 3         01/08/2022    9:12 AM 11/18/2021    2:58 PM 10/13/2021   10:54 AM  GAD 7 : Generalized Anxiety Score  Nervous, Anxious, on Edge 0 1 1  Control/stop worrying 1 1 1   Worry too much - different things 1 1 1   Trouble relaxing 0 0 0  Restless 0 0 0  Easily annoyed or irritable 1 1 1   Afraid - awful might happen 0 0 0  Total GAD 7 Score 3 4 4      Review of Systems:   Pertinent items are noted in HPI Denies abnormal vaginal discharge w/ itching/odor/irritation, headaches, visual changes, shortness of breath, chest pain, abdominal pain, severe nausea/vomiting, or problems with urination or bowel movements unless otherwise stated above. Pertinent History Reviewed:  Reviewed past medical,surgical, social, obstetrical and family history.  Reviewed problem list, medications and allergies. Physical Assessment:   Vitals:   02/26/22 1005  BP: (!) 186/115  Pulse: 100  There is no height or weight on file  to calculate BMI.           Physical Examination:   General appearance: alert, well appearing, and in no distress  Mental status: alert, oriented to person, place, and time  Skin: warm & dry   Extremities: Edema: None    Cardiovascular: normal heart rate noted  Respiratory: normal respiratory effort, no distress  Abdomen: gravid, soft, non-tender  Pelvic: Cervical exam deferred         Fetal Status:     Movement: Absent    Fetal Surveillance Testing today: I repeated a bedside sonogram at the request of the patient and once again confgirmed no fetal cardiac motion and the relatively small, unchanged retroplacental fluid collection at the cephalad/fundal edge of the placenta, likely blodd and likely the etiology of the fetal demise.   Chaperone: Celene Squibb    No results found for this or any previous visit (from the past 24 hour(s)).  Assessment & Plan:  High-risk pregnancy: G3P2002 at [redacted]w[redacted]d with an Estimated Date of Delivery: 04/14/22      ICD-10-CM   1. Fetal demise, [redacted]w[redacted]d, diagnosed today 02/24/22  O36.4XX0    I made arrangements for the aptient to be admitted Friday evening 02/27/22 @1900  for IOL, L&D charge notified(Amanda)  2. Chronic hypertension affecting pregnancy  O10.919    labetalol 100 BID but has not taken in 3 days, so her BP is quite high today, no headache or visual changes    3. History of DVT (deep vein thrombosis), on lovenox 100 mg BID  Z86.718     4. History of 2 cesarean sections  Z98.891    discussed at length with patient regarding delivery mode, repeat C section vs TOLAC, she is opting for TOLAC, either way I stopped her lovenox on 02/24/22        Meds: No orders of the defined types were placed in this encounter.   Orders: No orders of the defined types were placed in this encounter.    Labs/procedures today: U/S  Treatment Plan:  plan for IOL tomorrow night, staff notified    Follow-up: Return for IOL 02/27/22.   No future  appointments.  No orders of the defined types were placed in this encounter.  Florian Buff  Attending Physician for the [facility] 02/27/2022 6:36 PM

## 2022-02-27 NOTE — Anesthesia Preprocedure Evaluation (Signed)
Anesthesia Evaluation  Patient identified by MRN, date of birth, ID band Patient awake    Reviewed: Allergy & Precautions, H&P , NPO status , Patient's Chart, lab work & pertinent test results  History of Anesthesia Complications Negative for: history of anesthetic complications  Airway Mallampati: II  TM Distance: >3 FB     Dental   Pulmonary Current Smoker,    Pulmonary exam normal        Cardiovascular hypertension, + DVT   Rhythm:regular Rate:Normal     Neuro/Psych negative neurological ROS  negative psych ROS   GI/Hepatic negative GI ROS, Neg liver ROS,   Endo/Other  Morbid obesity  Renal/GU      Musculoskeletal   Abdominal   Peds  Hematology negative hematology ROS (+)   Anesthesia Other Findings On home lovenox, last dose 9/26  Reproductive/Obstetrics (+) Pregnancy (IUFD, prior C/S x2)                             Anesthesia Physical Anesthesia Plan  ASA: 3  Anesthesia Plan: Epidural   Post-op Pain Management:    Induction:   PONV Risk Score and Plan:   Airway Management Planned:   Additional Equipment:   Intra-op Plan:   Post-operative Plan:   Informed Consent: I have reviewed the patients History and Physical, chart, labs and discussed the procedure including the risks, benefits and alternatives for the proposed anesthesia with the patient or authorized representative who has indicated his/her understanding and acceptance.       Plan Discussed with:   Anesthesia Plan Comments:         Anesthesia Quick Evaluation

## 2022-02-27 NOTE — MAU Note (Signed)
Patient here for direct admit to Labor and Delivery. Diagnosed with IUFD on 9/26.

## 2022-02-27 NOTE — Anesthesia Procedure Notes (Signed)
Epidural Patient location during procedure: OB Start time: 02/27/2022 10:31 PM End time: 02/27/2022 10:41 PM  Staffing Anesthesiologist: Lidia Collum, MD Performed: anesthesiologist   Preanesthetic Checklist Completed: patient identified, IV checked, risks and benefits discussed, monitors and equipment checked, pre-op evaluation and timeout performed  Epidural Patient position: sitting Prep: DuraPrep Patient monitoring: heart rate, continuous pulse ox and blood pressure Approach: midline Location: L3-L4 Injection technique: LOR air  Needle:  Needle type: Tuohy  Needle gauge: 17 G Needle length: 9 cm Needle insertion depth: 7 cm Catheter type: closed end flexible Catheter size: 19 Gauge Catheter at skin depth: 12 cm Test dose: negative  Assessment Events: blood not aspirated, injection not painful, no injection resistance, no paresthesia and negative IV test  Additional Notes Reason for block:procedure for pain

## 2022-02-27 NOTE — Progress Notes (Signed)
Pt admitted for IOL IUFD. Has cHTN and noted to not be taking home BP meds recently. BP in severe ranges. Will start tx for cHTN now with nifedipine 60mg  qd, prn IV labetolol for SBP>160, DBP>105. Pre-E and DIC labs pending.   Shelda Pal, [place] Fellow, Faculty practice [place] for [facility] 02/27/22  9:50 PM

## 2022-02-27 NOTE — H&P (Signed)
Preoperative History and Physical  Yoshiye FREEDOM [place] is a 36 y.o. M2L0786 with Patient's last menstrual period was 07/08/2021 (approximate). admitted for a cervical ripening and IOL for 33 week fetal loss.  Hx of C section x2 but would like to proceed with TOLAC based on conversation yesterday Sonogram suspicious for placental abruption as etiology of the loss  PMH:    Past Medical History:  Diagnosis Date   History of blood clots    Hypertension    Sickle cell trait (HCC)     PSH:     Past Surgical History:  Procedure Laterality Date   CESAREAN SECTION     IUD REMOVAL  2010    POb/GynH:      OB History     Gravida  3   Para  2   Term  2   Preterm      AB      Living  2      SAB      IAB      Ectopic      Multiple      Live Births  2           SH:   Social History   Tobacco Use   Smoking status: Every Day    Packs/day: 1.00    Years: 20.00    Total pack years: 20.00    Types: Cigarettes   Smokeless tobacco: Never  Vaping Use   Vaping Use: Former  Substance Use Topics   Alcohol use: Not Currently   Drug use: Not Currently    Types: Marijuana    FH:    Family History  Problem Relation Age of Onset   Diabetes Mother    Hypertension Mother    Diabetes Brother      Allergies:  Allergies  Allergen Reactions   Tomato Hives and Itching    Medications:      No current facility-administered medications for this encounter.  Review of Systems:   Review of Systems  Constitutional: Negative for fever, chills, weight loss, malaise/fatigue and diaphoresis.  HENT: Negative for hearing loss, ear pain, nosebleeds, congestion, sore throat, neck pain, tinnitus and ear discharge.   Eyes: Negative for blurred vision, double vision, photophobia, pain, discharge and redness.  Respiratory: Negative for cough, hemoptysis, sputum production, shortness of breath, wheezing and stridor.   Cardiovascular: Negative for chest pain, palpitations, orthopnea,  claudication, leg swelling and PND.  Gastrointestinal: Positive for abdominal pain. Negative for heartburn, nausea, vomiting, diarrhea, constipation, blood in stool and melena.  Genitourinary: Negative for dysuria, urgency, frequency, hematuria and flank pain.  Musculoskeletal: Negative for myalgias, back pain, joint pain and falls.  Skin: Negative for itching and rash.  Neurological: Negative for dizziness, tingling, tremors, sensory change, speech change, focal weakness, seizures, loss of consciousness, weakness and headaches.  Endo/Heme/Allergies: Negative for environmental allergies and polydipsia. Does not bruise/bleed easily.  Psychiatric/Behavioral: Negative for depression, suicidal ideas, hallucinations, memory loss and substance abuse. The patient is not nervous/anxious and does not have insomnia.      PHYSICAL EXAM:  Blood pressure (!) 189/118, pulse 94, temperature 98.3 F (36.8 C), temperature source Oral, resp. rate 18, last menstrual period 07/08/2021, SpO2 95 %.    Vitals reviewed. Constitutional: She is oriented to person, place, and time. She appears well-developed and well-nourished.  HENT:  Head: Normocephalic and atraumatic.  Right Ear: External ear normal.  Left Ear: External ear normal.  Nose: Nose normal.  Mouth/Throat: Oropharynx is clear and  moist.  Eyes: Conjunctivae and EOM are normal. Pupils are equal, round, and reactive to light. Right eye exhibits no discharge. Left eye exhibits no discharge. No scleral icterus.  Neck: Normal range of motion. Neck supple. No tracheal deviation present. No thyromegaly present.  Cardiovascular: Normal rate, regular rhythm, normal heart sounds and intact distal pulses.  Exam reveals no gallop and no friction rub.   No murmur heard. Respiratory: Effort normal and breath sounds normal. No respiratory distress. She has no wheezes. She has no rales. She exhibits no tenderness.  GI: Soft. Bowel sounds are normal. She exhibits no  distension and no mass. There is tenderness. There is no rebound and no guarding.  Genitourinary:       Vulva is normal without lesions Vagina is pink moist without discharge Cervix normal in appearance and pap is normal Uterus is enlarged, 33 weeks size Adnexa is negative with normal sized ovaries by sonogram  Musculoskeletal: Normal range of motion. She exhibits no edema and no tenderness.  Neurological: She is alert and oriented to person, place, and time. She has normal reflexes. She displays normal reflexes. No cranial nerve deficit. She exhibits normal muscle tone. Coordination normal.  Skin: Skin is warm and dry. No rash noted. No erythema. No pallor.  Psychiatric: She has a normal mood and affect. Her behavior is normal. Judgment and thought content normal.    Labs: No results found for this or any previous visit (from the past 336 hour(s)).  EKG: No orders found for this or any previous visit.  Imaging Studies: US OB Follow Up  Result Date: 02/24/2022 Table formatting from the original result was not included. Images from the original result were not included.  ..an [employer] [facility] Diplomatic Services operational officer) accredited practice Center for [facility] @ [address] [place] [address] Ordering Provider: Christin Fudge, CNM FOLLOW UP SONOGRAM TERELL LUBRANO is in the office for a follow up sonogram for [facility] of DVT She is a 36 y.o. year old G36P2002 with Estimated Date of Delivery: 04/14/22 by LMP now at  [redacted]w[redacted]d weeks gestation. 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 AMNIOTIC FLUID: The amniotic fluid volume is  normal, 11 cm. PLACENTA LOCALIZATION:  anterior GRADE 2 CERVIX: LIMITED VIEW GESTATIONAL AGE AND  BIOMETRICS: Gestational criteria: Estimated  Date of Delivery: 04/14/22 by LMP now at [redacted]w[redacted]d Previous Scans:2          BIPARIETAL DIAMETER           7.40 cm         29+5 weeks HEAD CIRCUMFERENCE           28.80 cm         31+5 weeks ABDOMINAL CIRCUMFERENCE           26.13 cm         30+2 weeks FEMUR LENGTH           5.83 cm         30+3 weeks  AVERAGE EGA(BY THIS SCAN):  30+4 weeks                                                 ESTIMATED FETAL WEIGHT:       1572  grams, 2 % SUSPECTED ABNORMALITIES:  Yes,NO FHT QUALITY OF SCAN: Limited view TECHNICIAN COMMENTS: [place] 33 wks(by LMP),fetal demise,cephalic,6.9 x 2.1 cm retroplacental abruption,anterior placenta gr 2,no fetal heart tones,AFI 11 cm,EFW 1572 g=30+4 wks,Dr Elonda Husky scanned and discussed results with patient A copy of this report including all images has been saved and backed up to a second source for retrieval if needed. All measures and details of the anatomical scan, placentation, fluid volume and pelvic anatomy are contained in that report. Amber Heide Guile 02/24/2022 11:35 AM Clinical Impression and recommendations: I have reviewed the sonogram results above, combined with the patient's current clinical course, below are my impressions and any appropriate recommendations for management based on the sonographic findings. 1.  [id number] Estimated Date of Delivery: 04/14/22 by serial sonographic evaluations 2.  Fetal sonographic surveillance findings: Fetal demise measuring [redacted]w[redacted]d There is a small collection of fluid, likely blood behind the placenta at the fundal edge, does not track down, consistent with a small, <10% abruption a). Normal fluid volume, AFI 11 b). Measures consistent with [redacted]w[redacted]d 3.  Normal general sonographic findings Recommend continued prenatal evaluations and care based on this sonogram and as clinically indicated from the patient's clinical course. Florian Buff 02/24/2022 12:11 PM       Assessment: >[redacted]w[redacted]d Estimated Date of Delivery:  04/14/22  >Fetal demise, 30 weeks size, small retroplacental fluid suspicious for placental abruption as etiology >Previous C section x 2, pt opts for TOLAC >CHTN on labetalol 100 BID and aspirin 162, has not taken for a few days, will do eval for pre eclampsia and treat BP as needed >Hx of DVT on lovenox 100 BID, therapeutic dosing, stopped 02/24/22 in anticipation of regional anesthesia >AMA  Plan: Cervical ripening and labor induction DIC panel Pr/Cr ratio  Mertie Clause Jayde Daffin 02/27/2022 7:25 PM

## 2022-02-28 ENCOUNTER — Encounter (HOSPITAL_COMMUNITY): Payer: Self-pay | Admitting: Obstetrics & Gynecology

## 2022-02-28 LAB — MAGNESIUM: Magnesium: 4.5 mg/dL — ABNORMAL HIGH (ref 1.7–2.4)

## 2022-02-28 LAB — RPR: RPR Ser Ql: NONREACTIVE

## 2022-02-28 MED ORDER — MISOPROSTOL 200 MCG PO TABS
ORAL_TABLET | ORAL | Status: AC
  Filled 2022-02-28: qty 4

## 2022-02-28 MED ORDER — LACTATED RINGERS IV SOLN: INTRAVENOUS | Status: DC

## 2022-02-28 MED ORDER — MAGNESIUM SULFATE BOLUS VIA INFUSION
4.0000 g | Freq: Once | INTRAVENOUS | Status: AC
  Filled 2022-02-28: qty 1000

## 2022-02-28 MED ORDER — MAGNESIUM SULFATE 40 GM/1000ML IV SOLN
2.0000 g/h | INTRAVENOUS | Status: AC
  Administered 2022-02-28 – 2022-03-02 (×4): 2 g/h via INTRAVENOUS
  Filled 2022-02-28 (×3): qty 1000

## 2022-02-28 MED ORDER — BUTALBITAL-APAP-CAFFEINE 50-325-40 MG PO TABS
2.0000 | ORAL_TABLET | Freq: Once | ORAL | Status: AC
  Administered 2022-02-28: 2 via ORAL
  Filled 2022-02-28: qty 2

## 2022-02-28 MED ORDER — OXYTOCIN-SODIUM CHLORIDE 30-0.9 UT/500ML-% IV SOLN
INTRAVENOUS | Status: AC
  Administered 2022-03-02: 2.5 [IU]/h via INTRAVENOUS
  Filled 2022-02-28: qty 500

## 2022-02-28 MED ORDER — MISOPROSTOL 50MCG HALF TABLET
50.0000 ug | ORAL_TABLET | ORAL | Status: DC
  Administered 2022-02-28: 50 ug via BUCCAL
  Filled 2022-02-28: qty 1

## 2022-02-28 MED ORDER — MISOPROSTOL 25 MCG QUARTER TABLET
25.0000 ug | ORAL_TABLET | ORAL | Status: DC
  Administered 2022-02-28: 25 ug via VAGINAL
  Filled 2022-02-28: qty 1

## 2022-02-28 MED ORDER — MISOPROSTOL 50MCG HALF TABLET
50.0000 ug | ORAL_TABLET | ORAL | Status: DC
  Administered 2022-02-28: 50 ug via VAGINAL
  Filled 2022-02-28: qty 1

## 2022-02-28 MED ORDER — MISOPROSTOL 50MCG HALF TABLET
50.0000 ug | ORAL_TABLET | ORAL | Status: DC
  Administered 2022-02-28: 50 ug via ORAL
  Filled 2022-02-28: qty 1

## 2022-02-28 MED ORDER — NALBUPHINE HCL 10 MG/ML IJ SOLN
5.0000 mg | INTRAMUSCULAR | Status: DC | PRN
  Administered 2022-02-28: 5 mg via INTRAVENOUS
  Filled 2022-02-28: qty 1

## 2022-02-28 MED ORDER — METHYLERGONOVINE MALEATE 0.2 MG/ML IJ SOLN
INTRAMUSCULAR | Status: AC
  Filled 2022-02-28: qty 1

## 2022-02-28 MED ORDER — MAGNESIUM SULFATE 40 GM/1000ML IV SOLN
INTRAVENOUS | Status: AC
  Administered 2022-02-28: 4 g via INTRAVENOUS
  Filled 2022-02-28: qty 1000

## 2022-02-28 NOTE — Progress Notes (Signed)
Labor Progress Note Stacie Oliver is a 36 y.o. G3P2002 at [redacted]w[redacted]d presented for IOL due to IUFD  S: No acute concerns at this time.   O:  BP (!) 134/93   Pulse 88   Temp 97.9 F (36.6 C)   Resp 16   Ht 5\' 1"  (1.549 m)   Wt 105.2 kg   LMP 07/08/2021 (Approximate)   SpO2 99%   BMI 43.84 kg/m  EFM: IUFD  CVE: Dilation: Closed Effacement (%): Thick Station: -3 Presentation: Vertex Exam by:: Autry lott   A&P: 36 y.o. M7E7209 [redacted]w[redacted]d here for TOLAC IOL due to IUFD. #Labor: slow progress, still closed. S/p cytotec X 5 doses. FB inserted, start pit at 10pm cap at 4 until FB comes out. When FB comes out increase pit 2by2.  #Pain: Epidural in place, comfortable #FWB: IUFD #GBS  unknown #cHTN with Superimposed pre-eclampsia; h/ache concerning for severe features: h/ache improved with tylenol.  Continue magnesium. Close blood pressure monitoring. - AM labs  Gerlene Fee, DO OB Fellow, [place] for [name] 02/28/2022, 9:38 PM

## 2022-02-28 NOTE — Progress Notes (Addendum)
Labor Progress Note Stacie Oliver is a 36 y.o. G3P2002 at [redacted]w[redacted]d presented for IOL due to IUFD S: Patient is resting comfortably, epidural in place.  O:  BP 134/81   Pulse 71   Temp 98 F (36.7 C) (Oral)   Resp 17   Ht 5\' 1"  (1.549 m)   Wt 105.2 kg   LMP 07/08/2021 (Approximate)   SpO2 99%   BMI 43.84 kg/m  EFM: IUFD  CVE: Dilation: Closed Effacement (%): Thick Exam by:: Dr. Dorena Cookey   A&P: 36 y.o. [id number] [redacted]w[redacted]d here for IOL due to IUFD #Labor: Progressing slowly. Cytotec x2 with no change. Continue third dose per Dr. Elonda Husky. Unable to place foley balloon at this time. #Pain: Epidural in place. #FWB: IUFD #GBS  unknown #cHTN: nifedipine 77m qd, prnIV labetolol for SBP>160, DBP>105, protein creatinine ratio 1.41 (prior .464) patient meets criteria for SIPE.CTM for severe range BP  Salvadore Oxford, MD, PGY-1 [facility] [place] [facility] 7:51 AM 02/28/2022

## 2022-02-28 NOTE — Progress Notes (Signed)
Labor Progress Note Stacie Oliver is a 36 y.o. G3P2002 at [redacted]w[redacted]d presented for IOL due to IUFD. S: Patient is resting comfortably, epidural in place.  O:  BP (!) 142/86   Pulse 74   Temp 98.4 F (36.9 C) (Oral)   Resp 17   Ht 5\' 1"  (1.549 m)   Wt 105.2 kg   LMP 07/08/2021 (Approximate)   SpO2 99%   BMI 43.84 kg/m  EFM: IUFD  CVE: Dilation: Closed Effacement (%): Thick Exam by:: Dr. Dorena Cookey   A&P: 36 y.o. [name] [redacted]w[redacted]d for IOL due to IUFD. #Labor:Another 40mcg Cytotec oral despite TOLAC per Dr. Elonda Husky. Recheck in 4 hours to consider Foley balloon placement. #Pain: Epidural #FWB: IUFD #GBS  unknown #cHTN: nifedipine 79m qd, prnIV labetolol for SBP>160, DBP>105, protein creatinine ratio 1.41 (prior .464) patient meets criteria for SIPE.CTM for severe range BP  Shelda Pal, DO FMOB Fellow, Faculty practice [place] for [place] 02/28/22  3:35 AM

## 2022-02-28 NOTE — Progress Notes (Signed)
Labor Progress Note VI BIDDINGER is a 36 y.o. G3P2002 at [redacted]w[redacted]d presented for IOL due to IUFD S: doing well. No new concerns.  O:  BP 108/63   Pulse 83   Temp 98.2 F (36.8 C) (Oral)   Resp 18   Ht 5\' 1"  (1.549 m)   Wt 105.2 kg   LMP 07/08/2021 (Approximate)   SpO2 99%   BMI 43.84 kg/m  EFM: IUFD  CVE: Dilation: Closed Effacement (%): Thick Station: -3 Presentation: Vertex Exam by:: Acey Lav RN   A&P: 36 y.o. [id number] [redacted]w[redacted]d here for TOLAC IOL due to IUFD. #Labor: slow progress, still closed. S/p cytotec X 5 doses. Plan is for a  50PO/50vaginal dose next. Confirmed vertex by bedside US. #Pain: Epidural in place, comfortable #FWB: IUFD #GBS  unknown #cHTN with Superimposed pre-eclampsia; h/ache concerning for severe features: h/ache improved with tylenol.  Continue magnesium. Close blood pressure monitoring. - get CBC, CMP.  Mg level q 6hrs.  Liliane Channel MD MPH OB Fellow, [place] for [place] 02/28/2022

## 2022-02-28 NOTE — Progress Notes (Signed)
Pt comfortable, not teary and appropriate for situation. Discussed with patient about autopsy, funeral homes, chaplain, pictures, molding and holding baby after birth. Patient agreed with holding baby and pictures, undecided about the rest.

## 2022-02-28 NOTE — Progress Notes (Addendum)
Labor Progress Note Stacie Oliver is a 36 y.o. G3P2002 at [redacted]w[redacted]d presented for IOL due to IUFD S: patient doing well. Has had a h/ache, requiring fioricet X2 today. No visual changes or RUQ pain. Blood pressures are in the 140s/70s. Taking nifedipine as scheduled.   O:  BP 123/78   Pulse (!) 116   Temp 97.6 F (36.4 C) (Axillary)   Resp 18   Ht 5\' 1"  (1.549 m)   Wt 105.2 kg   LMP 07/08/2021 (Approximate)   SpO2 99%   BMI 43.84 kg/m  EFM: IUFD  CVE: Dilation: Closed Effacement (%): Thick Station: -3 Presentation: Vertex Exam by:: Dr. Trina Ao   A&P: 36 y.o. [id number] [redacted]w[redacted]d here for TOLAC IOL due to IUFD. #Labor: slow progress, still closed, can't place a cervical balloon. Has had 3 doses of cytotec. Plan for 50 PO/25 vaginal cytotec now.. this plan has been agreed upon by the team, given fetal demise. Confirmed vertex by bedside US. #Pain: Epidural in place, comfortable #FWB: IUFD #GBS  unknown #cHTN with Superimposed pre-eclampsia; h/ache concerning for severe features. Start magnesium sulf for seizure prophlaxis - get CBC, CMP.  Mg level q 6hrs.  Liliane Channel MD MPH OB Fellow, [place] for [place] 02/28/2022

## 2022-02-28 NOTE — Progress Notes (Signed)
Upon my first interaction with the patient this morning around 0730, I spoke with the patient about how she was doing and reminded her that I was here to be her advocate, to listen to her, believe her, and make sure that she is safe and supported.  I spoke with her about her risk factors and told her that I would keep her updated if anything changes so that she can make sure she is making the best decisions for herself.  I reminded her of her autonomy in her care and talked with her about potential complications that may arise (starting magnesium, bleeding, need for c-section, etc).  I made sure that she was feeling safe with her visitor while he was outside of the room.    Pt relayed that this pregnancy loss has been extremely difficult due to the fact that her mother wished death on this fetus due to her job as a Administrator.  [place], [place], and I reassured our support of her throughout this process and gave her a code word to call out for should any visitors show up that she does not want here.

## 2022-03-01 LAB — COMPREHENSIVE METABOLIC PANEL
ALT: 10 U/L (ref 0–44)
AST: 16 U/L (ref 15–41)
Albumin: 2.2 g/dL — ABNORMAL LOW (ref 3.5–5.0)
Alkaline Phosphatase: 102 U/L (ref 38–126)
Anion gap: 13 (ref 5–15)
BUN: <5 mg/dL — ABNORMAL LOW (ref 6–20)
CO2: 21 mmol/L — ABNORMAL LOW (ref 22–32)
Calcium: 7.1 mg/dL — ABNORMAL LOW (ref 8.9–10.3)
Chloride: 102 mmol/L (ref 98–111)
Creatinine, Ser: 0.76 mg/dL (ref 0.44–1.00)
GFR, Estimated: >60 mL/min (ref >60)
Glucose, Bld: 106 mg/dL — ABNORMAL HIGH (ref 70–99)
Potassium: 3.2 mmol/L — ABNORMAL LOW (ref 3.5–5.1)
Sodium: 136 mmol/L (ref 135–145)
Total Bilirubin: 0.3 mg/dL (ref 0.3–1.2)
Total Protein: 5.6 g/dL — ABNORMAL LOW (ref 6.5–8.1)

## 2022-03-01 LAB — CBC
HCT: 35 % — ABNORMAL LOW (ref 36.0–46.0)
Hemoglobin: 12.3 g/dL (ref 12.0–15.0)
MCH: 27.6 pg (ref 26.0–34.0)
MCHC: 35.1 g/dL (ref 30.0–36.0)
MCV: 78.5 fL — ABNORMAL LOW (ref 80.0–100.0)
Platelets: 351 10*3/uL (ref 150–400)
RBC: 4.46 MIL/uL (ref 3.87–5.11)
RDW: 14.1 % (ref 11.5–15.5)
WBC: 11.3 10*3/uL — ABNORMAL HIGH (ref 4.0–10.5)
nRBC: 0 % (ref 0.0–0.2)

## 2022-03-01 LAB — MAGNESIUM: Magnesium: 5 mg/dL — ABNORMAL HIGH (ref 1.7–2.4)

## 2022-03-01 NOTE — Progress Notes (Signed)
Labor Progress Note Stacie Oliver is a 36 y.o. G3P2002 at [redacted]w[redacted]d presented for IOL due to IUFD  S: Resting comforatble with family at bedside.   O:  BP (!) 151/93   Pulse 86   Temp 97.9 F (36.6 C) (Oral)   Resp 18   Ht 5\' 1"  (1.549 m)   Wt 105.2 kg   LMP 07/08/2021 (Approximate)   SpO2 98%   BMI 43.84 kg/m  EFM: IUFD  CVE: Dilation: 1 Effacement (%): 80 Station: -3 Presentation: Vertex Exam by:: Acey Lav RN   A&P: 36 y.o. [id number] [redacted]w[redacted]d here for TOLAC IOL due to IUFD. #Labor: FB still in place. 1.5 cm dilation. Concern for ROM due to greenish tinge fluid dripping from foley tube. Patient is comfortable and vitals are stable. Will continue with induction and when FB out plan on titrating pitocin up with a max of 20.  #Pain: Epidural in place, comfortable #FWB: IUFD #GBS  unknown #cHTN with Superimposed pre-eclampsia; h/ache concerning for severe features: h/ache improved with tylenol.  Continue magnesium. Close blood pressure monitoring. AM labs wnl.   Gifford Shave, MD  03/01/22 6:09 PM

## 2022-03-01 NOTE — Progress Notes (Signed)
Labor Progress Note BENNETT VANSCYOC is a 36 y.o. G3P2002 at [redacted]w[redacted]d presented for IOL due to IUFD  S: Sleeping soundly.   O:  BP 123/89   Pulse (!) 101   Temp 98 F (36.7 C) (Oral)   Resp 18   Ht 5\' 1"  (1.549 m)   Wt 105.2 kg   LMP 07/08/2021 (Approximate)   SpO2 99%   BMI 43.84 kg/m  EFM: IUFD  CVE: Dilation: Closed Effacement (%): Thick Station: -3 Presentation: Vertex Exam by:: Erskine Speed lott MD   A&P: 36 y.o. [id number] [redacted]w[redacted]d here for TOLAC IOL due to IUFD. #Labor:When FB comes out increase pit 2by2.  #Pain: Epidural in place, comfortable #FWB: IUFD #GBS  unknown #cHTN with Superimposed pre-eclampsia; h/ache concerning for severe features: h/ache improved with tylenol.  Continue magnesium. Close blood pressure monitoring. - AM labs  Gerlene Fee, DO OB Fellow, [place] for [place] 03/01/2022, 1:10 AM

## 2022-03-01 NOTE — Progress Notes (Signed)
Labor Progress Note Stacie Oliver is a 36 y.o. G3P2002 at [redacted]w[redacted]d presented for IOL due to IUFD  S: Resting. No acute concerns.   O:  BP (!) 116/53   Pulse 82   Temp 98.1 F (36.7 C) (Oral)   Resp 18   Ht 5\' 1"  (1.549 m)   Wt 105.2 kg   LMP 07/08/2021 (Approximate)   SpO2 99%   BMI 43.84 kg/m  EFM: IUFD  CVE: Dilation: Closed Effacement (%): Thick Station: -3 Presentation: Vertex Exam by:: Erskine Speed lott MD   A&P: 36 y.o. [id number] [redacted]w[redacted]d here for TOLAC IOL due to IUFD. #Labor:When FB comes out increase pit 2by2 and consider AROM when appropriate. #Pain: Epidural in place, comfortable #FWB: IUFD #GBS  unknown #cHTN with Superimposed pre-eclampsia; h/ache concerning for severe features: h/ache improved with tylenol.  Continue magnesium. Close blood pressure monitoring. - AM labs  Gerlene Fee, DO OB Fellow, [place] for [place] 03/01/2022, 5:47 AM

## 2022-03-01 NOTE — Progress Notes (Addendum)
Labor Progress Note Stacie Oliver is a 36 y.o. G3P2002 at [redacted]w[redacted]d presented for IOL due to IUFD  S: No acute concerns.   O:  BP 122/75   Pulse 84   Temp 98.3 F (36.8 C)   Resp 16   Ht 5\' 1"  (1.549 m)   Wt 105.2 kg   LMP 07/08/2021 (Approximate)   SpO2 99%   BMI 43.84 kg/m  EFM: IUFD  CVE: Dilation: 1 Effacement (%): Thick Station: -3 Presentation: Vertex Exam by:: Janus Molder, MD   A&P: 36 y.o. [id number] [redacted]w[redacted]d here for TOLAC IOL due to IUFD. #Labor: FB out, internal os 1cm. External os 2cm. 2nd FB placed at exeternal os w/ 40 cc. Continue pitocin. Consider AROM when appropriate. If induction process continues to be slow or not progressing can consider c-section which would not be ideal in this situation.  #Pain: Epidural in place, comfortable #FWB: IUFD #GBS  unknown #cHTN with Superimposed pre-eclampsia; h/ache concerning for severe features: h/ache improved with tylenol.  Continue magnesium. Close blood pressure monitoring. AM labs wnl.   Gerlene Fee, DO OB Fellow, [place] for [place] 03/01/2022, 6:43 AM
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
## 2022-03-02 ENCOUNTER — Encounter (HOSPITAL_COMMUNITY): Payer: Self-pay | Admitting: Obstetrics & Gynecology

## 2022-03-02 ENCOUNTER — Inpatient Hospital Stay (HOSPITAL_COMMUNITY): Payer: 59

## 2022-03-02 DIAGNOSIS — O09523 Supervision of elderly multigravida, third trimester: Secondary | ICD-10-CM

## 2022-03-02 DIAGNOSIS — O1414 Severe pre-eclampsia complicating childbirth: Secondary | ICD-10-CM

## 2022-03-02 DIAGNOSIS — Z3A33 33 weeks gestation of pregnancy: Secondary | ICD-10-CM

## 2022-03-02 DIAGNOSIS — R52 Pain, unspecified: Secondary | ICD-10-CM

## 2022-03-02 DIAGNOSIS — O364XX0 Maternal care for intrauterine death, not applicable or unspecified: Secondary | ICD-10-CM

## 2022-03-02 DIAGNOSIS — O34211 Maternal care for low transverse scar from previous cesarean delivery: Secondary | ICD-10-CM

## 2022-03-02 LAB — COMPREHENSIVE METABOLIC PANEL
ALT: 15 U/L (ref 0–44)
ALT: 15 U/L (ref 0–44)
AST: 32 U/L (ref 15–41)
AST: 32 U/L (ref 15–41)
Albumin: 2.2 g/dL — ABNORMAL LOW (ref 3.5–5.0)
Albumin: 2.1 g/dL — ABNORMAL LOW (ref 3.5–5.0)
Alkaline Phosphatase: 115 U/L (ref 38–126)
Alkaline Phosphatase: 99 U/L (ref 38–126)
Anion gap: 12 (ref 5–15)
Anion gap: 10 (ref 5–15)
BUN: <5 mg/dL — ABNORMAL LOW (ref 6–20)
BUN: <5 mg/dL — ABNORMAL LOW (ref 6–20)
CO2: 19 mmol/L — ABNORMAL LOW (ref 22–32)
CO2: 20 mmol/L — ABNORMAL LOW (ref 22–32)
Calcium: 5.9 mg/dL — CL (ref 8.9–10.3)
Calcium: 5.7 mg/dL — CL (ref 8.9–10.3)
Chloride: 103 mmol/L (ref 98–111)
Chloride: 101 mmol/L (ref 98–111)
Creatinine, Ser: 0.69 mg/dL (ref 0.44–1.00)
Creatinine, Ser: 0.77 mg/dL (ref 0.44–1.00)
GFR, Estimated: >60 mL/min (ref >60)
GFR, Estimated: >60 mL/min (ref >60)
Glucose, Bld: 103 mg/dL — ABNORMAL HIGH (ref 70–99)
Glucose, Bld: 111 mg/dL — ABNORMAL HIGH (ref 70–99)
Potassium: 3.6 mmol/L (ref 3.5–5.1)
Potassium: 3.3 mmol/L — ABNORMAL LOW (ref 3.5–5.1)
Sodium: 134 mmol/L — ABNORMAL LOW (ref 135–145)
Sodium: 131 mmol/L — ABNORMAL LOW (ref 135–145)
Total Bilirubin: 0.2 mg/dL — ABNORMAL LOW (ref 0.3–1.2)
Total Bilirubin: 0.3 mg/dL (ref 0.3–1.2)
Total Protein: 5.8 g/dL — ABNORMAL LOW (ref 6.5–8.1)
Total Protein: 5.8 g/dL — ABNORMAL LOW (ref 6.5–8.1)

## 2022-03-02 LAB — CBC
HCT: 36.9 % (ref 36.0–46.0)
Hemoglobin: 12.9 g/dL (ref 12.0–15.0)
MCH: 27.3 pg (ref 26.0–34.0)
MCHC: 35 g/dL (ref 30.0–36.0)
MCV: 78.2 fL — ABNORMAL LOW (ref 80.0–100.0)
Platelets: 377 10*3/uL (ref 150–400)
RBC: 4.72 MIL/uL (ref 3.87–5.11)
RDW: 14 % (ref 11.5–15.5)
WBC: 22.3 10*3/uL — ABNORMAL HIGH (ref 4.0–10.5)
nRBC: 0 % (ref 0.0–0.2)

## 2022-03-02 LAB — DIC (DISSEMINATED INTRAVASCULAR COAGULATION)PANEL
D-Dimer, Quant: 7.74 ug/mL-FEU — ABNORMAL HIGH (ref 0.00–0.50)
Fibrinogen: >800 mg/dL — ABNORMAL HIGH (ref 210–475)
INR: 1.1 (ref 0.8–1.2)
Platelets: 383 10*3/uL (ref 150–400)
Prothrombin Time: 13.6 seconds (ref 11.4–15.2)
Smear Review: NONE SEEN
aPTT: 29 seconds (ref 24–36)

## 2022-03-02 LAB — MAGNESIUM: Magnesium: 6 mg/dL — ABNORMAL HIGH (ref 1.7–2.4)

## 2022-03-02 MED ORDER — ACETAMINOPHEN 325 MG PO TABS
650.0000 mg | ORAL_TABLET | ORAL | Status: DC | PRN
  Administered 2022-03-02: 650 mg via ORAL
  Filled 2022-03-02: qty 2

## 2022-03-02 MED ORDER — ZOLPIDEM TARTRATE 5 MG PO TABS: 5.0000 mg | ORAL_TABLET | Freq: Every evening | ORAL | Status: DC | PRN

## 2022-03-02 MED ORDER — WITCH HAZEL-GLYCERIN EX PADS: 1.0000 | MEDICATED_PAD | CUTANEOUS | Status: DC | PRN

## 2022-03-02 MED ORDER — SIMETHICONE 80 MG PO CHEW: 80.0000 mg | CHEWABLE_TABLET | ORAL | Status: DC | PRN

## 2022-03-02 MED ORDER — TETANUS-DIPHTH-ACELL PERTUSSIS 5-2.5-18.5 LF-MCG/0.5 IM SUSY: 0.5000 mL | PREFILLED_SYRINGE | Freq: Once | INTRAMUSCULAR | Status: DC

## 2022-03-02 MED ORDER — CALCIUM GLUCONATE-NACL 2-0.675 GM/100ML-% IV SOLN
2.0000 g | Freq: Once | INTRAVENOUS | Status: DC
  Filled 2022-03-02: qty 100

## 2022-03-02 MED ORDER — PRENATAL MULTIVITAMIN CH
1.0000 | ORAL_TABLET | Freq: Every day | ORAL | Status: DC
  Administered 2022-03-02 – 2022-03-03 (×2): 1 via ORAL
  Filled 2022-03-02 (×2): qty 1

## 2022-03-02 MED ORDER — ENOXAPARIN SODIUM 100 MG/ML IJ SOSY
100.0000 mg | PREFILLED_SYRINGE | Freq: Two times a day (BID) | INTRAMUSCULAR | Status: DC
  Administered 2022-03-02 – 2022-03-03 (×2): 100 mg via SUBCUTANEOUS
  Filled 2022-03-02 (×3): qty 1

## 2022-03-02 MED ORDER — CALCIUM GLUCONATE-NACL 2-0.675 GM/100ML-% IV SOLN
2.0000 g | Freq: Once | INTRAVENOUS | Status: AC
  Administered 2022-03-02: 2000 mg via INTRAVENOUS
  Filled 2022-03-02: qty 100

## 2022-03-02 MED ORDER — ONDANSETRON HCL 4 MG PO TABS: 4.0000 mg | ORAL_TABLET | ORAL | Status: DC | PRN

## 2022-03-02 MED ORDER — ONDANSETRON HCL 4 MG/2ML IJ SOLN: 4.0000 mg | INTRAMUSCULAR | Status: DC | PRN

## 2022-03-02 MED ORDER — DIBUCAINE (PERIANAL) 1 % EX OINT: 1.0000 | TOPICAL_OINTMENT | CUTANEOUS | Status: DC | PRN

## 2022-03-02 MED ORDER — SENNOSIDES-DOCUSATE SODIUM 8.6-50 MG PO TABS: 2.0000 | ORAL_TABLET | Freq: Every day | ORAL | Status: DC

## 2022-03-02 MED ORDER — FUROSEMIDE 20 MG PO TABS
20.0000 mg | ORAL_TABLET | Freq: Two times a day (BID) | ORAL | Status: DC
  Administered 2022-03-03: 20 mg via ORAL
  Filled 2022-03-02: qty 1

## 2022-03-02 MED ORDER — DIPHENHYDRAMINE HCL 25 MG PO CAPS: 25.0000 mg | ORAL_CAPSULE | Freq: Four times a day (QID) | ORAL | Status: DC | PRN

## 2022-03-02 MED ORDER — BENZOCAINE-MENTHOL 20-0.5 % EX AERO: 1.0000 | INHALATION_SPRAY | CUTANEOUS | Status: DC | PRN

## 2022-03-02 MED ORDER — POTASSIUM CHLORIDE CRYS ER 20 MEQ PO TBCR
40.0000 meq | EXTENDED_RELEASE_TABLET | Freq: Every day | ORAL | Status: DC
  Administered 2022-03-02 – 2022-03-03 (×2): 40 meq via ORAL
  Filled 2022-03-02 (×2): qty 2

## 2022-03-02 MED ORDER — IBUPROFEN 600 MG PO TABS
600.0000 mg | ORAL_TABLET | Freq: Four times a day (QID) | ORAL | Status: DC
  Administered 2022-03-02 – 2022-03-03 (×6): 600 mg via ORAL
  Filled 2022-03-02 (×6): qty 1

## 2022-03-02 MED ORDER — COCONUT OIL OIL: 1.0000 | TOPICAL_OIL | Status: DC | PRN

## 2022-03-02 NOTE — Progress Notes (Signed)
Labor Progress Note Stacie Oliver is a 36 y.o. G3P2002 at [redacted]w[redacted]d presented for IOL due to IUFD  S: Feeling nauseated.   O:  BP (!) 114/56   Pulse 80   Temp 97.8 F (36.6 C) (Oral)   Resp 16   Ht 5\' 1"  (1.549 m)   Wt 105.2 kg   LMP 07/08/2021 (Approximate)   SpO2 98%   BMI 43.84 kg/m  EFM: IUFD  CVE: Dilation: 2 Effacement (%): Thick Station: -3 Presentation: Vertex Exam by:: Dr. Janus Molder   A&P: 36 y.o. [id number] [redacted]w[redacted]d here for TOLAC IOL due to IUFD. #Labor: Continue to increase pitocin.  #Pain: Epidural in place, comfortable #FWB: IUFD #GBS  unknown #cHTN with Superimposed pre-eclampsia; h/ache concerning for severe features: h/ache improved with tylenol.  Continue magnesium. Close blood pressure monitoring. AM labs pending.  Social concerns- SW consult PP  Nausea- give anti-nausea medication. Suspect drop in BP v. Benadryl for itching contributing. Will get mag level now.   Gerlene Fee, DO OB Fellow, [place] for [place] 03/02/2022, 2:13 AM

## 2022-03-02 NOTE — Discharge Summary (Signed)
Postpartum Discharge Summary  Date of Service updated 03/03/22     Patient Name: Stacie Oliver DOB: 1985/07/05 MRN: [id number]  Date of admission: 02/27/2022 Delivery date:03/02/2022  Delivering provider: Gerlene Fee  Date of discharge: 03/03/2022  Admitting diagnosis: Fetal demise, greater than 22 weeks, antepartum, single or unspecified fetus [O36.4XX0] Intrauterine pregnancy: [redacted]w[redacted]d    Secondary diagnosis:  Principal Problem:   Fetal demise, greater than 22 weeks, antepartum, single or unspecified fetus Active Problems:   History of DVT (deep vein thrombosis)   History of 2 cesarean sections   Supervision of high risk pregnancy, antepartum   Chronic hypertension affecting pregnancy  Additional problems: n/a    Discharge diagnosis: Preterm Pregnancy Delivered, VBAC, Preeclampsia (severe), CHTN with superimposed preeclampsia, and IUFD                                               Post partum procedures: n/a Augmentation: Pitocin, Cytotec, and IP [facility]oley [facility] With [facility]aginal Delivery   36y.o. yo G3P2002 at 354w6das admitted to the hospital 02/27/2022 for induction of labor.  Indication for induction:  IUFD .  Patient had a labor course complicated by cHTN with SIPE w/ severe features.  Membrane Rupture Time/Date: 6:01 PM ,03/01/2022   Delivery Method:Vaginal, Spontaneous  Episiotomy: None  Lacerations:  None  Details of delivery can be found in separate delivery note.  Patient had a routine postpartum course.  She received 24 hours of magnesium sulfate post delivery. Blood pressure managed with oral medication. Patient is discharged home 03/03/22.  Newborn Data: Birth date:03/02/2022  Birth time:2:24 AM  Gender:Female  Living status:Fetal Demise  Apgars: ,  Weight:1381 g   Magnesium Sulfate received: yes BMZ received: No Rhophylac:No MMR:No T-DaP:Given prenatally Flu: No Transfusion:No  Physical exam   Vitals:   03/03/22 0230 03/03/22 0530 03/03/22 0755 03/03/22 1143  BP: (!) 143/80 (!) 140/74 137/84 136/78  Pulse: (!) 101 100 (!) 109 92  Resp: _0 Temp:  98.1 F (36.7 C) 98.6 F (37 C) 99.6 F (37.6 C)  TempSrc:  Oral Oral Oral  SpO2: 98% 98% 99% 100%  Weight:      Height:       General: alert, cooperative, and no distress Lochia: appropriate Uterine Fundus: firm Incision: N/A DVT Evaluation: No evidence of DVT seen on physical exam. No cords or calf tenderness. Labs: Lab Results  Component Value Date   WBC 22.3 (H) 03/02/2022   HGB 12.9 03/02/2022   HCT 36.9 03/02/2022   MCV 78.2 (L) 03/02/2022   PLT 383 03/02/2022   PLT 377 03/02/2022      Latest Ref Rng & Units 03/03/2022   10:26 AM  CMP  Glucose 70 - 99 mg/dL 91   BUN 6 - 20 mg/dL 7   Creatinine 0.44 - 1.00 mg/dL 0.77   Sodium 135 - 145 mmol/L 136   Potassium 3.5 - 5.1 mmol/L 3.5   Chloride 98 - 111 mmol/L 106   CO2 22 - 32 mmol/L 22   Calcium 8.9 - 10.3 mg/dL 6.1   Total Protein 6.5 - 8.1 g/dL 6.2   Total Bilirubin 0.3 - 1.2 mg/dL 0.3   Alkaline Phos 38 - 126 U/L 117   AST 15 - 41 U/L 28   ALT 0 -  44 U/L 15    Edinburgh Score:     No data to display           After visit meds:  Allergies as of 03/03/2022       Reactions   Tomato Hives, Itching        Medication List     STOP taking these medications    aspirin EC 81 MG tablet   folic acid 1 MG tablet Commonly known as: FOLVITE   labetalol 100 MG tablet Commonly known as: NORMODYNE   prenatal multivitamin Tabs tablet       TAKE these medications    acetaminophen 500 MG tablet Commonly known as: TYLENOL Take 500 mg by mouth every 6 (six) hours as needed.   Blood Pressure Monitor Automat Devi Take BP at home per provider instructions   Calcium Carb-Cholecalciferol 600-10 MG-MCG Tabs Take 1 tablet by mouth 2 (two) times daily for 7 days.   enoxaparin 100 MG/ML injection Commonly known as: Lovenox Inject 1  mL (100 mg total) into the skin every 12 (twelve) hours.   ferrous sulfate 324 MG Tbec Take 324 mg by mouth.   furosemide 20 MG tablet Commonly known as: LASIX Take 1 tablet (20 mg total) by mouth 2 (two) times daily for 4 days.   ibuprofen 600 MG tablet Commonly known as: ADVIL Take 1 tablet (600 mg total) by mouth every 6 (six) hours.   NIFEdipine 60 MG 24 hr tablet Commonly known as: ADALAT CC Take 1 tablet (60 mg total) by mouth daily.   potassium chloride SA 20 MEQ tablet Commonly known as: KLOR-CON M Take 2 tablets (40 mEq total) by mouth daily for 5 days. Start taking on: March 04, 2022         Discharge home in stable condition Infant [place] Discharge instruction: per After Visit Summary and Postpartum booklet. Activity: Advance as tolerated. Pelvic rest for 6 weeks.  Diet: routine diet Future Appointments: Future Appointments  Date Time Provider [place]  03/11/2022 10:10 AM Myrtis Ser, CNM [facility]  04/15/2022  9:50 AM Myrtis Ser, CNM [facility]   Follow up Visit:  Follow-up Information     [facility] Follow up in 1 week(s).   Specialty: Obstetrics and Gynecology Why: BP check Contact information: [place] [phone]        [facility] Follow up in 5 week(s).   Specialty: Obstetrics and Gynecology Why: postpartum visit Contact information: [place] [phone]                Message sent to [facility] by [place] on 03/03/2022  Please schedule this patient for a Virtual postpartum visit in 6 weeks with the following provider: Any provider. Additional Postpartum F/U:Postpartum Depression checkup and BP check 1 week  High risk pregnancy complicated by:  cHTN w/ SIPE and IUFD Delivery mode:  Vaginal, Spontaneous  Anticipated Birth Control:  Unsure, pt declines current  [facility]   03/03/2022 Griffin Basil, MD
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
## 2022-03-02 NOTE — Progress Notes (Signed)
  Initial visit with Stacie Oliver and her husband at pt's beside. At the time of visit, Stacie Oliver was sitting in a chair next to baby [name] in the [employer]. Chaplain asked open ended questions to facilitate emotional expression and story telling. Stacie Oliver shared that Oscar's death was unexpected and devastating. She reports waiting 8 years for another baby and had been longing for a son for a long time as she has 2 teenage daughters. Stacie Oliver feels well supported by her husband's family, but is working through some conflict with her maternal family. Chaplain commended pt's boundary setting and encouraged her to give herself grace as she manages her own grief, rather than focusing on restoring a relationship she's not ready to restore yet. Chaplain also provided education on grief and various responses as well as local resources including [facility] online grief support group.  Please page as further needs arise.  Donald Prose. Elyn Peers, M.Div. [facility] Chaplain Pager [phone] Office [phone]     03/02/22 0930  Clinical Encounter Type  Visited With Patient and family together  Visit Type Initial;Spiritual support;Psychological support;Social support  Referral From Nurse  Spiritual Encounters  Spiritual Needs Emotional;Grief support

## 2022-03-02 NOTE — Anesthesia Postprocedure Evaluation (Signed)
Anesthesia Post Note  Patient: Stacie Oliver  Procedure(s) Performed: AN AD HOC LABOR EPIDURAL     Patient location during evaluation: Mother Baby Anesthesia Type: Epidural Level of consciousness: awake and alert Pain management: pain level controlled Vital Signs Assessment: post-procedure vital signs reviewed and stable Respiratory status: spontaneous breathing, nonlabored ventilation and respiratory function stable Cardiovascular status: stable Postop Assessment: no headache, no backache and epidural receding Anesthetic complications: no   No notable events documented.  Last Vitals:  Vitals:   03/02/22 0728 03/02/22 0801  BP: (!) 155/87 125/78  Pulse: (!) 112 92  Resp:  18  Temp:  37.3 C  SpO2:  100%    Last Pain:  Vitals:   03/02/22 0801  TempSrc: Oral  PainSc:    Pain Goal: Patients Stated Pain Goal: 2 (03/01/22 0130)                 Birdena Crandall, [name]

## 2022-03-03 ENCOUNTER — Other Ambulatory Visit (HOSPITAL_COMMUNITY): Payer: Self-pay

## 2022-03-03 LAB — COMPREHENSIVE METABOLIC PANEL
ALT: 15 U/L (ref 0–44)
AST: 28 U/L (ref 15–41)
Albumin: 2.3 g/dL — ABNORMAL LOW (ref 3.5–5.0)
Alkaline Phosphatase: 117 U/L (ref 38–126)
Anion gap: 8 (ref 5–15)
BUN: 7 mg/dL (ref 6–20)
CO2: 22 mmol/L (ref 22–32)
Calcium: 6.1 mg/dL — CL (ref 8.9–10.3)
Chloride: 106 mmol/L (ref 98–111)
Creatinine, Ser: 0.77 mg/dL (ref 0.44–1.00)
GFR, Estimated: >60 mL/min (ref >60)
Glucose, Bld: 91 mg/dL (ref 70–99)
Potassium: 3.5 mmol/L (ref 3.5–5.1)
Sodium: 136 mmol/L (ref 135–145)
Total Bilirubin: 0.3 mg/dL (ref 0.3–1.2)
Total Protein: 6.2 g/dL — ABNORMAL LOW (ref 6.5–8.1)

## 2022-03-03 LAB — SURGICAL PATHOLOGY

## 2022-03-03 MED ORDER — NIFEDIPINE ER 60 MG PO TB24
60.0000 mg | ORAL_TABLET | Freq: Every day | ORAL | 2 refills | Status: AC
  Filled 2022-03-03 – 2022-04-24 (×2): qty 30, 30d supply, fill #0

## 2022-03-03 MED ORDER — CALCIUM CARB-CHOLECALCIFEROL 600-10 MG-MCG PO TABS
1.0000 | ORAL_TABLET | Freq: Two times a day (BID) | ORAL | 0 refills | Status: AC
  Filled 2022-03-03: qty 14, 7d supply, fill #0

## 2022-03-03 MED ORDER — POTASSIUM CHLORIDE CRYS ER 20 MEQ PO TBCR
40.0000 meq | EXTENDED_RELEASE_TABLET | Freq: Every day | ORAL | 0 refills | Status: AC
  Filled 2022-03-03: qty 10, 5d supply, fill #0

## 2022-03-03 MED ORDER — FUROSEMIDE 20 MG PO TABS
20.0000 mg | ORAL_TABLET | Freq: Two times a day (BID) | ORAL | 0 refills | Status: DC
  Filled 2022-03-03: qty 8, 4d supply, fill #0

## 2022-03-03 MED ORDER — IBUPROFEN 600 MG PO TABS
600.0000 mg | ORAL_TABLET | Freq: Four times a day (QID) | ORAL | 1 refills | Status: AC
  Filled 2022-03-03 – 2022-04-24 (×2): qty 30, 8d supply, fill #0
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
## 2022-03-03 NOTE — Progress Notes (Signed)
Date and time results received: 03/03/22 1133 (use smartphrase ".now" to insert current time)  Test: Calcium Critical Value: 6.1  Name of Provider Notified: Dr. Elgie Congo  Orders Received? Or Actions Taken?:Provider at bedside.

## 2022-03-03 NOTE — Progress Notes (Signed)
POSTPARTUM PROGRESS NOTE  Post Partum Day 1  Subjective:  Stacie Oliver is a 36 y.o. [id number] s/p SVD with IUFD along with CHTN with superimposed preeclampsia at [redacted]w[redacted]d.  She reports she is doing well. No acute events overnight. The patient has finished 24 hours of magnesium sulfate. She denies any problems with ambulating, voiding or po intake. Denies nausea or vomiting.  Pain is well controlled.  Lochia is normal.  Objective: Blood pressure 137/84, pulse (!) 109, temperature 98.6 F (37 C), temperature source Oral, resp. rate 18, height 5\' 1"  (1.549 m), weight 105.2 kg, last menstrual period 07/08/2021, SpO2 99 %.  Physical Exam:  General: alert, cooperative and no distress Chest: no respiratory distress Heart:regular rate, distal pulses intact Abdomen: soft, nontender,  Uterine Fundus: firm, appropriately tender DVT Evaluation: No calf swelling or tenderness Extremities: trace edema Skin: warm, dry  Recent Labs    03/01/22 0440 03/02/22 0538  HGB 12.3 12.9  HCT 35.0* 36.9    Assessment/Plan: Stacie Oliver is a 36 y.o. [id number] s/p SVD with IUFD at [redacted]w[redacted]d   PPD#1 - Doing well  Routine postpartum care Pt had calcium replaced and also is replacing potassium due to the lasix Contraception: pt denies, advised no pregnancy due to 12-14 months Dispo:anticipate discharge this afternoon after labs reviewed.   LOS: 4 days   Lynnda Shields, MD Faculty attending 03/03/2022, 10:27 AM

## 2022-03-11 ENCOUNTER — Ambulatory Visit: Payer: 59 | Admitting: Advanced Practice Midwife

## 2022-03-11 ENCOUNTER — Encounter: Payer: Self-pay | Admitting: Advanced Practice Midwife

## 2022-03-11 ENCOUNTER — Ambulatory Visit (INDEPENDENT_AMBULATORY_CARE_PROVIDER_SITE_OTHER): Payer: 59 | Admitting: Advanced Practice Midwife

## 2022-03-11 VITALS — BP 138/97 | HR 89 | Wt 210.0 lb

## 2022-03-11 DIAGNOSIS — R69 Illness, unspecified: Secondary | ICD-10-CM | POA: Diagnosis not present

## 2022-03-11 DIAGNOSIS — F53 Postpartum depression: Secondary | ICD-10-CM | POA: Diagnosis not present

## 2022-03-11 NOTE — Progress Notes (Signed)
   GYN VISIT Patient name: Stacie Oliver MRN [id number]  Date of birth: 1985-12-14 Chief Complaint:   Follow-up (Depression check and bp check)  History of Present Illness:   Stacie Oliver is a 36 y.o. G41P2102 African-American female being seen today for f/u from [place] after 2 c/s x 9 days ago with 33wk IUFD. This was complicated by cHTN with SIPE (home on ProcardiaX 60mg - didn't take this morning) and also hx of DVT (Lovenox 100mg  bid- not taking everyday). Today she feels like she is coping as well as can be expected; has a lot of support; is getting outside some each day. Denies SI/HI plan. Reports light vag bleeding and some back/tailbone pain.  No LMP recorded. The current method of family planning is abstinence.  Last pap June 2023. Results were: ASCUS w/ HRHPV negative      03/11/2022   10:37 AM  Edinburgh Postnatal Depression Scale Screening Tool  I have been able to laugh and see the funny side of things. 1  I have looked forward with enjoyment to things. 2  I have blamed myself unnecessarily when things went wrong. 2  I have been anxious or worried for no good reason. 2  I have felt scared or panicky for no good reason. 2  Things have been getting on top of me. 3  I have been so unhappy that I have had difficulty sleeping. 2  I have felt sad or miserable. 2  I have been so unhappy that I have been crying. 2  The thought of harming myself has occurred to me. 1  Edinburgh Postnatal Depression Scale Total 19      Review of Systems:   Pertinent items are noted in HPI Denies fever/chills, dizziness, headaches, visual disturbances, fatigue, shortness of breath, chest pain, abdominal pain, vomiting, abnormal vaginal discharge/itching/odor/irritation, problems with periods, bowel movements, urination, or intercourse unless otherwise stated above.  Pertinent History Reviewed:  Reviewed past medical,surgical, social, obstetrical and family history.  Reviewed problem list,  medications and allergies. Physical Assessment:   Vitals:   03/11/22 1042  BP: (!) 138/97  Pulse: 89  Weight: 210 lb (95.3 kg)  Body mass index is 39.68 kg/m.       Physical Examination:   General appearance: alert, well appearing, and in no distress  Mental status: alert, oriented to person, place, and time  Skin: warm & dry   Cardiovascular: normal heart rate noted  Respiratory: normal respiratory effort, no distress  Abdomen: soft, non-tender   Pelvic: examination not indicated  Extremities: no edema    No results found for this or any previous visit (from the past 24 hour(s)).  Assessment & Plan:  1) s/p VBAC x 9d ago from IUFD> physically recovering and requests to go back to work 03/23/22- note given  2) cHTN with SIPE> taking ProcardiaXL 60mg  (didn't take today); BP elevated at mild range  3) PPD, Edinburgh 19; declines medication; denies SI/HI; open to speaking with Ros[facility]- ordered  4) Hx DVT, has Lovenox 100mg  bid ordered; isn't taking regularly- discussed  Meds: No orders of the defined types were placed in this encounter.   Orders Placed This Encounter  Procedures   Ambulatory referral to [facility]    Return for As scheduled.  Myrtis Ser CNM 03/11/2022 11:16 AM

## 2022-03-11 NOTE — Patient Instructions (Addendum)
Try this website that has helpful resources for postpartum depression: Www.postpartum.net  You can call Stacie Oliver if you don't hear from her in a week: [phone]

## 2022-03-26 ENCOUNTER — Encounter: Payer: Medicaid Other | Admitting: Licensed Clinical Social Worker

## 2022-03-30 ENCOUNTER — Encounter: Payer: Self-pay | Admitting: Women's Health

## 2022-04-15 ENCOUNTER — Ambulatory Visit: Payer: 59 | Admitting: Advanced Practice Midwife

## 2022-04-24 ENCOUNTER — Other Ambulatory Visit: Payer: Self-pay | Admitting: Obstetrics and Gynecology

## 2022-04-24 ENCOUNTER — Other Ambulatory Visit: Payer: Self-pay | Admitting: Advanced Practice Midwife

## 2022-04-24 ENCOUNTER — Other Ambulatory Visit (HOSPITAL_COMMUNITY): Payer: Self-pay

## 2022-04-27 ENCOUNTER — Encounter: Payer: Self-pay | Admitting: Advanced Practice Midwife

## 2022-04-27 ENCOUNTER — Ambulatory Visit (INDEPENDENT_AMBULATORY_CARE_PROVIDER_SITE_OTHER): Payer: 59 | Admitting: Advanced Practice Midwife

## 2022-04-27 NOTE — Patient Instructions (Addendum)
Call Asher Muir to make a therapy appointment at [phone]

## 2022-04-27 NOTE — Progress Notes (Signed)
Post Partum Visit Note   Chief Complaint:   Postpartum Care (IUFD [redacted]w[redacted]d)  History of Present Illness:   Stacie Oliver is a 36 y.o. G3P2102 African American female being seen today for a postpartum visit. She is 7 weeks postpartum following a vaginal birth after cesarean (VBAC) at 33 gestational weeks. IOL: Yes, for  IUFD . Anesthesia: epidural.  Laceration: none.  Complications: none. Inpatient contraception: no.   Pregnancy complicated by IUFD  and CHTN Tobacco use: no. Substance use disorder: no. Last pap smear: 2023 and results were ASCUS w/ HRHPV negative. Next pap smear due: 2026 Patient's last menstrual period was 04/06/2022 (within days).  Postpartum course has been complicated by continued HTN, rx'd procardia, last dose  .yesterday. Bleeding no bleeding. Bowel function is normal. Bladder function is normal. Urinary incontinence? No, fecal incontinence? No Patient is sexually active. The pregnancy intention screening data noted above was reviewed. Potential methods of contraception were discussed. The patient elected to proceed with No data recorded.  Edinburgh Postpartum Depression Screening: Negative  Edinburgh Postnatal Depression Scale - 04/27/22 1448       Edinburgh Postnatal Depression Scale:  In the Past 7 Days   I have been able to laugh and see the funny side of things. 0    I have looked forward with enjoyment to things. 0    I have blamed myself unnecessarily when things went wrong. 2    I have been anxious or worried for no good reason. 2    I have felt scared or panicky for no good reason. 2    Things have been getting on top of me. 1    I have been so unhappy that I have had difficulty sleeping. 2    I have felt sad or miserable. 0    I have been so unhappy that I have been crying. 1    The thought of harming myself has occurred to me. 0    Edinburgh Postnatal Depression Scale Total 10             Review of Systems:   Pertinent items are noted in  HPI Denies Abnormal vaginal discharge w/ itching/odor/irritation, headaches, visual changes, shortness of breath, chest pain, abdominal pain, severe nausea/vomiting, or problems with urination or bowel movements. Pertinent History Reviewed:  Reviewed past medical,surgical, obstetrical and family history.  Reviewed problem list, medications and allergies. OB History  Gravida Para Term Preterm AB Living  3 3 2 1   2  SAB IAB Ectopic Multiple Live Births        0 2    # Outcome Date GA Lbr Len/2nd Weight Sex Delivery Anes PTL Lv  3 Preterm 03/02/22 [redacted]w[redacted]d  3 lb 0.7 oz (1.381 kg) M Vag-Spont EPI  FD  2 Term 12/17/06   7 lb (3.175 kg) F CS-LTranv   LIV     Birth Comments: RCS  1 Term 09/09/05   7 lb (3.175 kg) F CS-LTranv   LIV     Birth Comments: C/S for FTP per pt   Physical Assessment:   Vitals:   04/27/22 1446 04/27/22 1451  BP: (!) 155/102 (!) 142/97  Pulse: 82 71  Weight: 216 lb (98 kg)   Height: 5' 1" (1.549 m)   Body mass index is 40.81 kg/m.  Objective:  Blood pressure (!) 142/97, pulse 71, height 5' 1" (1.549 m), weight 216 lb (98 kg), last menstrual period 04/06/2022, not currently breastfeeding.  General:  alert, cooperative, and   no distress   Breasts:  negative  Lungs: Normal respiratory effort  Heart:  regular rate and rhythm  Abdomen: soft, non-tender,    Vulva:  not evaluated  Vagina: not evaluated  Cervix:  normal  Corpus: Well involuted  Adnexa:  not evaluated  Rectal Exam: no hemorrhoids          No results found for this or any previous visit (from the past 24 hour(s)).  Assessment & Plan:  1) Postpartum exam 2) 6 wks s/p vaginal birth after cesarean (VBAC) 3)  CHTN:  encouraged to take meds daily,  4) Depression screening 5) Contraception doesn't want any.  Encouraged PNV  Essential components of care per ACOG recommendations:  1.  Mood and well being:  If positive depression screen, discussed and plan developed.  If using tobacco we discussed  reduction/cessation and risk of relapse If current substance abuse, we discussed and referral to local resources was offered.   2. Infant care and feeding:  If breastfeeding, discussed returning to work, pumping, breastfeeding-associated pain, guidance regarding return to fertility while lactating if not using another method. If needed, patient was provided with a letter to be allowed to pump q 2-3hrs to support lactation in a private location with access to a refrigerator to store breastmilk.   Recommended that all caregivers be immunized for flu, pertussis and other preventable communicable diseases If pt does not have material needs met for her/baby, referred to local resources for help obtaining these.  3. Sexuality, contraception and birth spacing Provided guidance regarding sexuality, management of dyspareunia, and resumption of intercourse Discussed avoiding interpregnancy interval <6mths and recommended birth spacing of 18 months  4. Sleep and fatigue Discussed coping options for fatigue and sleep disruption Encouraged family/partner/community support of 4 hrs of uninterrupted sleep to help with mood and fatigue  5. Physical recovery  If pt had a C/S, assessed incisional pain and providing guidance on normal vs prolonged recovery If pt had a laceration, perineal healing and pain reviewed.  If urinary or fecal incontinence, discussed management and referred to PT or uro/gyn if indicated  Patient is safe to resume physical activity. Discussed attainment of healthy weight.  6.  Chronic disease management Discussed pregnancy complications if any, and their implications for future childbearing and long-term maternal health. Review recommendations for prevention of recurrent pregnancy complications, such as aspirin to reduce risk of preeclampsia yes. Pt had GDM: No. If yes, 2hr GTT scheduled: not applicable. Reviewed medications and non-pregnant dosing including consideration of whether  pt is breastfeeding using a reliable resource such as LactMed: not applicable Referred for f/u w/ PCP or subspecialist providers as indicated: yes  7. Health maintenance Mammogram at 36yo or earlier if indicated Pap smears as indicated  Meds: No orders of the defined types were placed in this encounter.   Follow-up: No follow-ups on file.   No orders of the defined types were placed in this encounter.       Cresenzo-Dishmon DNP, CNM [facility] 04/27/2022 3:30 PM       

## 2022-05-05 ENCOUNTER — Other Ambulatory Visit (HOSPITAL_COMMUNITY): Payer: Self-pay
